# Patient Record
Sex: Female | Born: 1945 | Race: White | Hispanic: No | Marital: Married | Smoking: Never smoker
Health system: Southern US, Community
[De-identification: ages and names within clinical notes are randomized; demographics above are authoritative.]

## PROBLEM LIST (undated history)

## (undated) DIAGNOSIS — E7800 Pure hypercholesterolemia, unspecified: Secondary | ICD-10-CM

## (undated) DIAGNOSIS — I10 Essential (primary) hypertension: Secondary | ICD-10-CM

## (undated) DIAGNOSIS — E119 Type 2 diabetes mellitus without complications: Secondary | ICD-10-CM

## (undated) HISTORY — PX: KNEE SURGERY: SHX244

---

## 2004-07-01 ENCOUNTER — Encounter: Admission: RE | Admit: 2004-07-01 | Discharge: 2004-07-01 | Payer: Self-pay | Admitting: Family Medicine

## 2005-01-30 ENCOUNTER — Encounter: Admission: RE | Admit: 2005-01-30 | Discharge: 2005-01-30 | Payer: Self-pay | Admitting: Family Medicine

## 2005-02-10 ENCOUNTER — Ambulatory Visit (HOSPITAL_BASED_OUTPATIENT_CLINIC_OR_DEPARTMENT_OTHER): Admission: RE | Admit: 2005-02-10 | Discharge: 2005-02-10 | Payer: Self-pay | Admitting: Orthopedic Surgery

## 2005-02-10 ENCOUNTER — Ambulatory Visit (HOSPITAL_COMMUNITY): Admission: RE | Admit: 2005-02-10 | Discharge: 2005-02-10 | Payer: Self-pay | Admitting: Orthopedic Surgery

## 2007-04-01 ENCOUNTER — Other Ambulatory Visit: Admission: RE | Admit: 2007-04-01 | Discharge: 2007-04-01 | Payer: Self-pay | Admitting: Family Medicine

## 2007-04-12 ENCOUNTER — Encounter: Admission: RE | Admit: 2007-04-12 | Discharge: 2007-04-12 | Payer: Self-pay | Admitting: Family Medicine

## 2007-07-11 ENCOUNTER — Inpatient Hospital Stay (HOSPITAL_COMMUNITY): Admission: RE | Admit: 2007-07-11 | Discharge: 2007-07-13 | Payer: Self-pay | Admitting: Orthopedic Surgery

## 2007-10-24 ENCOUNTER — Inpatient Hospital Stay (HOSPITAL_COMMUNITY): Admission: RE | Admit: 2007-10-24 | Discharge: 2007-10-26 | Payer: Self-pay | Admitting: Orthopedic Surgery

## 2008-01-10 ENCOUNTER — Ambulatory Visit (HOSPITAL_BASED_OUTPATIENT_CLINIC_OR_DEPARTMENT_OTHER): Admission: RE | Admit: 2008-01-10 | Discharge: 2008-01-10 | Payer: Self-pay | Admitting: Orthopedic Surgery

## 2009-01-08 ENCOUNTER — Ambulatory Visit (HOSPITAL_COMMUNITY): Admission: RE | Admit: 2009-01-08 | Discharge: 2009-01-08 | Payer: Self-pay | Admitting: Orthopedic Surgery

## 2009-01-29 ENCOUNTER — Encounter: Admission: RE | Admit: 2009-01-29 | Discharge: 2009-01-29 | Payer: Self-pay | Admitting: Orthopedic Surgery

## 2009-02-20 ENCOUNTER — Encounter: Admission: RE | Admit: 2009-02-20 | Discharge: 2009-02-20 | Payer: Self-pay | Admitting: Orthopedic Surgery

## 2009-04-01 ENCOUNTER — Encounter: Admission: RE | Admit: 2009-04-01 | Discharge: 2009-04-01 | Payer: Self-pay | Admitting: Orthopedic Surgery

## 2009-06-20 ENCOUNTER — Encounter: Admission: RE | Admit: 2009-06-20 | Discharge: 2009-06-20 | Payer: Self-pay | Admitting: Orthopedic Surgery

## 2009-09-02 ENCOUNTER — Encounter: Admission: RE | Admit: 2009-09-02 | Discharge: 2009-09-02 | Payer: Self-pay | Admitting: Orthopedic Surgery

## 2009-10-31 ENCOUNTER — Encounter: Admission: RE | Admit: 2009-10-31 | Discharge: 2009-10-31 | Payer: Self-pay | Admitting: Orthopedic Surgery

## 2010-11-25 NOTE — Op Note (Signed)
NAMEROLINDA, IMPSON                 ACCOUNT NO.:  [id number]   MEDICAL RECORD NO.:  [id number]          PATIENT TYPE:  INP   LOCATION:  2550                         FACILITY:  [facility]   PHYSICIAN:  Robert A. Thurston Hole, M.D. DATE OF BIRTH:  07/25/1945   DATE OF PROCEDURE:  DATE OF DISCHARGE:                               OPERATIVE REPORT   PREOPERATIVE DIAGNOSIS:  Right knee degenerative joint disease.   POSTOPERATIVE DIAGNOSIS:  Right knee degenerative joint disease.   PROCEDURE:  Right total knee replacement using DePuy cemented total knee  system with #2.5 cemented femur and a #2.5 cemented tibia with 12.5 mm  polyethylene RP  tibial spacer and 32 mm polyethylene cemented patella.   SURGEON:  Elana Alm. Thurston Hole, M.D.   ASSISTANT:  Genelle Bal, PA   ANESTHESIA:  General.   OPERATIVE TIME:  1 hour and 20 minutes.   COMPLICATIONS:  None.   DESCRIPTION OF PROCEDURE:  Ms. Delawder  was brought in the operating room  on July 11, 2007 after a femoral nerve block was  placed in the  holding room by anesthesia.  She was placed on the operating room table  in the supine position.  She received Ancef 1 gm IV preoperatively for  prophylaxis.  Her right knee was examined under anesthesia.  Range of  motion -5 to 125 degrees with mild varus deformity of knee, stable  ligamentous examine with normal patellar tracking.  She had a Foley  catheter placed under sterile conditions after being placed under  general anesthesia.  Her right leg was then prepped using a sterile  DuraPrepand draped using sterile technique.  The leg was exsanguinated  and the thigh tourniquet elevated to 365 mm.  Initially, through a 15 cm  longitudinal incision based over the patella initial exposure was made.  The underlying subcutaneous tissues were incised along with the skin  incision.  A median arthrotomy was performed revealing an excessive  amount of normal-appearing joint fluid.  The articular surfaces  were  inspected.  She had grade 4 changes medially, grade 3 changes laterally,  and grade 3 and 4 changes in the patellofemoral joint.  Osteophytes were  removed from the femoral condyles and tibial plateau.  The medial and  lateral meniscal remnants were removed as well as the anterior cruciate  ligament.  An intramedullary drill was then drilled up the femoral canal  for replacement of the distal femoral cutting jig which was placed in  the appropriate amount of rotation, and a distal 11 mm cut was made.  Distal femur was then sized.  A 2.5 was found to be the appropriate  size, and then a 2.5 cutting jig was placed in the appropriate amount of  external rotation, and then these cuts were made.  Proximal tibia was  then exposed.  The tibia spines were removed with an oscillating saw.  The intramedullary drill was drilled down the tibial canal for placement  of the proximal tibial cutting jig which was placed in the appropriate  amount of position and rotation, and a proximal 6 mm cut was  made based  off the medial lower side.  Spacer blocks were then placed in flexion  and extension.  A 12.5 mm blocks gave excellent balancing and excellent  stability and excellent correction of her flexion and varus deformities.  The 2.5 mm tibial trial was placed and only cut tibial surface and found  to be an excellent fit, and then a keel cut was made.  The 2.5 femoral  trial was then placed, and with the 12.5 mm polyethylene RP spacer the  knee was reduced, taken through range of motion from 0 to 125 degrees  with excellent stability and excellent correction of her  flexion and  varus deformities.  The patella was then sized, and a resurfacing 8 mm  cut was then made, and three locking holes placed for a 32 mm patella.  The patella trial was placed and patellofemoral tracking was evaluated  and found to be normal.  At this point, it was felt that all the trial  components were of excellent size, fit  and stability.  They were then  removed, and the knee was the jet lavaged, irrigated with 3 L of saline.  The proximal tibia was then exposed, and the #2.5 tibial base plate with  cement backing and was hammered into position with an excellent fit with  excess cement being removed from around the edges.  The 2.5 femoral  component with cement backing was hammered into position also with an  excellent fit with excess cement being removed from around the edges.  The 12.5 mm polyethylene RP tibial spacer was placed on the tibial base  plate, the knee reduced, taken through range of motion from 0 to 125  degrees with excellent stability and excellent correction of her flexion  and varus deformities.  A 32 mm polyethylene cemented back patella was  then placed in its position and held there with a clamp.  After the  cement hardened, the patellofemoral tracking was again evaluated and  found to be normal.  At this point, it was felt that all the components  were of excellent size, fit and stability.  The knee was further  irrigated with saline and the knee tourniquet was released.  Hemostasis  was obtained with cautery.  The arthrotomy was then closed with #1  Ethibond suture over two medium Hemovac drains.  Subcutaneous tissue was  closed with 0 and 2-0 Vicryl.  Subcuticular layer was closed with 4-0  Monocryl.  Sterile dressings and a long-leg splint were applied.  The  patient was then awakened, extubated and taken to the recovery room in  stable condition.  Needle and sponge count was correct times two at the  end of the case.  Neurovascular status was normal as well.      Robert A. Thurston Hole, M.D.  Electronically Signed     RAW/MEDQ  D:  07/11/2007  T:  07/11/2007  Job:  [id number]

## 2010-11-25 NOTE — Op Note (Signed)
NAMEDAWN, CONVERY                 ACCOUNT NO.:  [id number]   MEDICAL RECORD NO.:  [id number]          PATIENT TYPE:  INP   LOCATION:  5001                         FACILITY:  [facility]   PHYSICIAN:  Robert A. Thurston Hole, M.D. DATE OF BIRTH:  March 22, 1946   DATE OF PROCEDURE:  10/24/2007  DATE OF DISCHARGE:                               OPERATIVE REPORT   PREOPERATIVE DIAGNOSIS:  Left knee degenerative joint disease.   POSTOPERATIVE DIAGNOSIS:  Left knee degenerative joint disease.   PROCEDURE:  Left total knee replacement using DePuy Cemented Total Knee  System with #2.5 cemented femur, #2.5 cemented tibia with 10-mm  polyethylene RP tibial spacer and 32-mm polyethylene cemented patella.   SURGEON:  Dr. Salvatore Marvel.   ASSISTANT:  Kirstin Shepperson, PA.   ANESTHESIA:  General.   OPERATIVE TIME:  One hour and 30 minutes.   COMPLICATIONS:  None.   DESCRIPTION OF PROCEDURE:  Ms. Malmberg was brought into operating room on  October 24, 2007, after a femoral nerve block was placed in holding by  anesthesia.  She was placed on the operative table in supine position.  She received Ancef 4 g IV preoperatively for prophylaxis.  After being  placed under general anesthesia, she had a Foley catheter placed under  sterile conditions.  Her left knee was examined under anesthesia.  Range  of motion from -5 to 125 degrees with mild varus deformity of knee, and  stable ligamentous exam with normal patellar tracking.  Left leg was  prepped using sterile DuraPrep and draped using sterile technique.  Leg  was exsanguinated and a thigh tourniquet elevated at 375 mm.  Initially,  through a 15-cm incision based over the patella, initial exposure was  made.  Underlying subcutaneous tissues were incised along with the skin  incision.  A median arthrotomy was performed revealing an excessive  amount of normal-appearing joint fluid.  The articular surfaces were  inspected.  She had grade 4 changes medially, grade  3 changes laterally,  and grade 3 and 4 changes in the patellofemoral joint.  Osteophytes  removed from the femoral condyles and tibial plateau.  The medial  lateral meniscal remnants were removed as well as the anterior cruciate  ligament.  An intramedullary drill was then drilled up the femoral canal  for placement of distal femoral cutting jig which was placed in the  appropriate amount of rotation, and a distal 11 mm cut was made.  The  distal femur was incised, 2.5 was found to be the appropriate size.  A  #2.5 cutting jig was placed in the appropriate amount of external  rotation, and then these cuts were made.  The proximal tibia was then  exposed.  The tibial spines were removed with an oscillating saw.  An  intramedullary drill was drilled down the tibial canal for placement of  proximal tibial cutting jig, which was placed in the appropriate amount  of rotation, and a proximal 4-mm cut was made based off the medial or  lower side.  Spacer blocks were then placed in flexion and extension.  The 10-mm blocks  gave excellent balancing, excellent stability, and  excellent correction of her flexion and varus deformities.  At this  point, the 2.5 tibial base plate trial was placed on the cut tibial  surface with an excellent fit, and the keel cut was made.  The 2.5 PCL  box cutter was placed on the distal femur, and then these cuts were  made.  At this point, the 2.5 femoral trial was placed with a 2.5 tibial  base plate trial and a 10-mm polyethylene RP tibial spacer.  The knee  was reduced, taken through range of motion from 0 to 125 degrees with  excellent stability and excellent correction of her flexion and varus  deformities and normal 32-mm patellar trial, which was placed, and again  patellofemoral tracking was evaluated and found to be normal.  At this  point, it was felt that all the trial components were of excellent size  and stability.  They were then removed.  The knee was  then jet lavage  irrigated with the use of saline.  The proximal tibia was then exposed.  A #2.5 tibial base plate trial was cemented back and was hammered in  position with an excellent fit with excess cement being removed from  around the edges.  The 2.5 femoral component with cement backing was  hammered in position also with an excellent fit with excess cement being  removed from around the edges.  The 10-mm polyethylene RP tibial spacer  was placed on tibial baseplate.  The knee reduced, taken through range  of motion from zero to 125 degrees with excellent stability and  excellent correction of her flexion and varus deformities.  The 32-mm  polyethylene cement backed patella was then placed in its position and  held there with a clamp.  After the cement hardened, again  patellofemoral tracking was evaluated and found to be normal.  At this  point, it was felt that all the components were of excellent size, fit,  and stability.  The wound was further irrigated with saline.  The  tourniquet was then released.  Hemostasis was obtained with cautery.  The arthrotomy was then closed with #1 Ethilon suture over 2 medium  Hemovac drains.  Subcutaneous tissues closed with zero 2-0 Vicryl,  subcuticular layer closed with 4-0 Monocryl.  Sterile dressings and long-  leg splint applied after the drains were hooked up to an [employer].  The  patient awakened, extubated, and taken to recovery in stable condition.  Needle and sponge count was correct x2 at the end of the case.  Neurovascular status normal.      Robert A. Thurston Hole, M.D.  Electronically Signed     RAW/MEDQ  D:  10/24/2007  T:  10/25/2007  Job:  [id number]

## 2010-11-25 NOTE — Op Note (Signed)
NAMEMARYBELLA, Kristin Roberts                 ACCOUNT NO.:  [id number]   MEDICAL RECORD NO.:  [id number]          PATIENT TYPE:  AMB   LOCATION:  DSC                          FACILITY:  [facility]   PHYSICIAN:  Robert A. Thurston Hole, M.D. DATE OF BIRTH:  Nov 19, 1945   DATE OF PROCEDURE:  01/10/2008  DATE OF DISCHARGE:  01/10/2008                               OPERATIVE REPORT   PREOPERATIVE DIAGNOSIS:  Left total knee arthrofibrosis.   POSTOPERATIVE DIAGNOSIS:  Left total knee arthrofibrosis.   PROCEDURE:  Left total knee examination under anesthesia followed by  manipulation with injection.   SURGEON:  Elana Alm. Thurston Hole, MD   ANESTHESIA:  General.   OPERATIVE TIME:  15 minutes.   COMPLICATIONS:  None.   INDICATIONS FOR PROCEDURE:  [name] is a woman who underwent a left  total knee replacement approximately 2-1/2 months ago.  She has had  persistent problems with loss of motion and pain, secondary to  arthrofibrosis despite aggressive physical therapy, and is now to  undergo EUA manipulation and injection.   DESCRIPTION OF PROCEDURE:  [name] was brought to the operating room  on January 10, 2008, placed on the operative table in supine position.  After being placed under general anesthesia, she received Ancef 1 g IV  preoperatively for prophylaxis.  Her left knee was examined under  anesthesia.  Initial range of motion was from minus 5 to 90 degrees.  A  gentle manipulation was carried out improving flexion to 120 degrees and  extension to minus 2 degrees, breaking up soft adhesions with normal  patellar tracking.  After this was done, the knee was injected under  sterile conditions with 40 mg of Depo-Medrol and 10 mL of 0.2% Marcaine  with epinephrine.  The knee was brought back to this range of motion  multiple times without complications.  At this point, she was awakened  and taken to recovery room in stable condition.  She tolerated the  procedure well.   FOLLOWUP CARE:  She will be  treated as an outpatient on Vicodin for pain  with early aggressive physical therapy.  She will be back in the office  in a week for recheck and followup.      Robert A. Thurston Hole, M.D.  Electronically Signed     RAW/MEDQ  D:  02/28/2008  T:  02/29/2008  Job:  [id number]

## 2010-11-28 NOTE — Op Note (Signed)
Kristin Roberts, Kristin Roberts                 ACCOUNT NO.:  [id number]   MEDICAL RECORD NO.:  [id number]          PATIENT TYPE:  AMB   LOCATION:  DSC                          FACILITY:  [facility]   PHYSICIAN:  Robert A. Thurston Hole, M.D. DATE OF BIRTH:  [birth year]   DATE OF PROCEDURE:  02/10/2005  DATE OF DISCHARGE:                                 OPERATIVE REPORT   PREOPERATIVE DIAGNOSIS:  1.  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  2.  Right shoulder rotator cuff tendinitis.   POSTOPERATIVE DIAGNOSIS:  1.  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  2.  Right shoulder rotator cuff tendinitis.   PROCEDURE:  1.  Right knee exam under anesthesia followed by arthroscopic partial medial      and lateral meniscectomies.  2.  Right knee chondroplasty with partial synovectomy.  3.  Right shoulder cortisone injection.   SURGEON:  Elana Alm. Thurston Hole, M.D.   ASSISTANT:  Julien Girt, P.A.-C.   ANESTHESIA:  Local/general.   OPERATIVE TIME:  45 minutes.   COMPLICATIONS:  None.   INDICATIONS FOR PROCEDURE:  Kristin Roberts is a 65 year old woman who has had  over six months of right knee and right shoulder pain with exam and MRI of  the knee showing medial meniscus tear with chondromalacia and synovitis and  exam consistent with right shoulder rotator cuff tendinitis.  She failed  conservative care and is now to undergo right knee arthroscopy and right  shoulder injection.   DESCRIPTION OF PROCEDURE:  Kristin Roberts was brought to the operating room on  February 10, 2005, after a knee block was placed in the holding room by  anesthesia.  She was placed on the operating table in supine position.  After being placed under MAC anesthesia, her right shoulder was examined.  She had full range of motion and her shoulder was stable.  The subacromial  space was injected with 40 mg Depo-Medrol and 3 mL of 0.25%  Marcaine under  sterile conditions.  Her right knee was examined and had range of motion  from 0 to 130 degrees, 1-2+ crepitation, and a stable ligamentous exam with  normal patellar tracking.  The right leg was prepped with DuraPrep and  draped using sterile technique.  Originally through an anterolateral portal,  the arthroscope with a pump attachment was placed and through an  anteromedial portal, an arthroscopic probe was placed.  On initial  inspection of the medial compartment, she was found to have 50% grade 3  chondromalacia which was debrided, medial meniscus tear, posterior and  medial horn, of which 50% was resected back to a stable rim.  The  intercondylar notch was inspected and the anterior and posterior cruciate  ligaments were normal.  The lateral compartment was inspected, 25% grade 3  and chondromalacia was debrided.  Lateral meniscus tear, 25%, posterolateral  corner, which was resected back to a stable rim.  The patellofemoral joint  showed 50% grade 3 chondromalacia which was debrided.  The patellas tracked  normally.  Moderate synovitis in the medial and lateral gutters were  debrided, otherwise, they were free of pathology.  After this was done, it  was felt that all pathology had been satisfactorily addressed.  The  instruments were removed.  The portals were closed with a 3-0 nylon suture.  The knee was injected with 0.25% Marcaine with epinephrine and 4 mg  morphine.  Sterile dressings were applied.  The patient was awakened and  taken to the recovery room in stable condition.   FOLLOW UP:  Kristin Roberts will be followed as an outpatient on Percocet and  Mobic.  She will be seen back in the office in a week for sutures out and  follow up.       RAW/MEDQ  D:  02/10/2005  T:  02/10/2005  Job:  [id number]

## 2011-04-07 LAB — URINE CULTURE
Colony Count: 25000
Colony Count: NO GROWTH
Culture: NO GROWTH

## 2011-04-07 LAB — DIFFERENTIAL
Basophils Absolute: 0
Basophils Relative: 1
Eosinophils Absolute: 0.1
Eosinophils Relative: 2
Lymphocytes Relative: 26
Lymphs Abs: 1.3
Monocytes Absolute: 0.4
Monocytes Relative: 7
Neutro Abs: 3.4
Neutrophils Relative %: 65

## 2011-04-07 LAB — COMPREHENSIVE METABOLIC PANEL
ALT: 26
AST: 25
Albumin: 4.3
Alkaline Phosphatase: 118 — ABNORMAL HIGH
BUN: 15
CO2: 26
Calcium: 9.8
Chloride: 102
Creatinine, Ser: 0.79
GFR calc Af Amer: >60
GFR calc non Af Amer: >60
Glucose, Bld: 123 — ABNORMAL HIGH
Potassium: 4.3
Sodium: 138
Total Bilirubin: 0.6
Total Protein: 7.7

## 2011-04-07 LAB — BASIC METABOLIC PANEL
BUN: 6
BUN: 7
CO2: 25
CO2: 30
Calcium: 8.5
Calcium: 8.8
Chloride: 102
Chloride: 100
Creatinine, Ser: 0.8
Creatinine, Ser: 0.8
GFR calc Af Amer: >60
GFR calc Af Amer: >60
GFR calc non Af Amer: >60
GFR calc non Af Amer: >60
Glucose, Bld: 159 — ABNORMAL HIGH
Glucose, Bld: 150 — ABNORMAL HIGH
Potassium: 4.1
Potassium: 3.5
Sodium: 134 — ABNORMAL LOW
Sodium: 137

## 2011-04-07 LAB — URINALYSIS, ROUTINE W REFLEX MICROSCOPIC
Bilirubin Urine: NEGATIVE
Glucose, UA: NEGATIVE
Hgb urine dipstick: NEGATIVE
Ketones, ur: NEGATIVE
Nitrite: NEGATIVE
Protein, ur: NEGATIVE
Specific Gravity, Urine: 1.016
Urobilinogen, UA: 0.2
pH: 6

## 2011-04-07 LAB — CBC
HCT: 37.4
HCT: 47.1 — ABNORMAL HIGH
Hemoglobin: 13.1
Hemoglobin: 16.1 — ABNORMAL HIGH
Hemoglobin: 12.6
MCHC: 34.2
MCHC: 35
MCV: 88
MCV: 88.2
Platelets: 181
Platelets: 237
RBC: 4.24
RBC: 5.35 — ABNORMAL HIGH
RBC: 4.12
RDW: 13
RDW: 13.3
RDW: 13.2
WBC: 5.2
WBC: 7.9
WBC: 8.5

## 2011-04-07 LAB — TYPE AND SCREEN
ABO/RH(D): A POS
Antibody Screen: NEGATIVE

## 2011-04-07 LAB — PROTIME-INR
INR: 0.9
INR: 1.1
INR: 1.5
Prothrombin Time: 12.1
Prothrombin Time: 14.9

## 2011-04-07 LAB — APTT: aPTT: 26

## 2011-04-07 LAB — URINE MICROSCOPIC-ADD ON

## 2011-04-09 LAB — POCT HEMOGLOBIN-HEMACUE: Hemoglobin: 13.7

## 2011-04-17 LAB — BASIC METABOLIC PANEL
BUN: 10
BUN: 11
CO2: 28
Chloride: 105
Chloride: 98
Creatinine, Ser: 0.74
Creatinine, Ser: 0.91
Glucose, Bld: 160 — ABNORMAL HIGH

## 2011-04-17 LAB — URINE CULTURE: Culture: NO GROWTH

## 2011-04-17 LAB — URINALYSIS, ROUTINE W REFLEX MICROSCOPIC
Bilirubin Urine: NEGATIVE
Hgb urine dipstick: NEGATIVE
Specific Gravity, Urine: 1.016
pH: 5.5

## 2011-04-17 LAB — CBC
Hemoglobin: 16.7 — ABNORMAL HIGH
MCHC: 34.4
MCV: 90.1
MCV: 90.9
Platelets: 208
Platelets: 208
RDW: 12.1
RDW: 12.6
RDW: 12.9
WBC: 12.3 — ABNORMAL HIGH
WBC: 6.7
WBC: 9.1

## 2011-04-17 LAB — COMPREHENSIVE METABOLIC PANEL
ALT: 39 — ABNORMAL HIGH
Albumin: 4.4
Alkaline Phosphatase: 105
Glucose, Bld: 121 — ABNORMAL HIGH
Potassium: 4.4
Sodium: 131 — ABNORMAL LOW
Total Protein: 7.7

## 2011-04-17 LAB — DIFFERENTIAL
Basophils Relative: 0
Eosinophils Absolute: 0.1 — ABNORMAL LOW
Monocytes Absolute: 0.4
Monocytes Relative: 6
Neutrophils Relative %: 71

## 2011-04-17 LAB — PROTIME-INR
Prothrombin Time: 14.2
Prothrombin Time: 18.7 — ABNORMAL HIGH

## 2011-04-17 LAB — URINE MICROSCOPIC-ADD ON

## 2011-04-17 LAB — TYPE AND SCREEN: Antibody Screen: NEGATIVE

## 2011-04-17 LAB — HEMOGLOBIN A1C: Mean Plasma Glucose: 172

## 2011-12-24 ENCOUNTER — Other Ambulatory Visit: Payer: Self-pay | Admitting: Internal Medicine

## 2011-12-24 DIAGNOSIS — R945 Abnormal results of liver function studies: Secondary | ICD-10-CM

## 2012-01-05 ENCOUNTER — Ambulatory Visit
Admission: RE | Admit: 2012-01-05 | Discharge: 2012-01-05 | Disposition: A | Discharge status: Home / Self Care | Payer: BC Managed Care – PPO | Source: Ambulatory Visit | Attending: Internal Medicine | Admitting: Internal Medicine

## 2012-01-05 DIAGNOSIS — R945 Abnormal results of liver function studies: Secondary | ICD-10-CM

## 2012-05-25 ENCOUNTER — Other Ambulatory Visit: Payer: Self-pay | Admitting: Internal Medicine

## 2012-05-25 DIAGNOSIS — Z1231 Encounter for screening mammogram for malignant neoplasm of breast: Secondary | ICD-10-CM

## 2012-06-28 ENCOUNTER — Ambulatory Visit
Admission: RE | Admit: 2012-06-28 | Discharge: 2012-06-28 | Disposition: A | Discharge status: Home / Self Care | Payer: Medicare Other | Source: Ambulatory Visit | Attending: Internal Medicine | Admitting: Internal Medicine

## 2012-06-28 DIAGNOSIS — Z1231 Encounter for screening mammogram for malignant neoplasm of breast: Secondary | ICD-10-CM

## 2016-04-14 ENCOUNTER — Other Ambulatory Visit: Payer: Self-pay | Admitting: Internal Medicine

## 2016-04-14 DIAGNOSIS — Z1231 Encounter for screening mammogram for malignant neoplasm of breast: Secondary | ICD-10-CM

## 2016-04-23 ENCOUNTER — Ambulatory Visit
Admission: RE | Admit: 2016-04-23 | Discharge: 2016-04-23 | Disposition: A | Discharge status: Home / Self Care | Payer: Medicare Other | Source: Ambulatory Visit | Attending: Internal Medicine | Admitting: Internal Medicine

## 2016-04-23 DIAGNOSIS — Z1231 Encounter for screening mammogram for malignant neoplasm of breast: Secondary | ICD-10-CM

## 2016-04-28 ENCOUNTER — Other Ambulatory Visit: Payer: Self-pay | Admitting: Internal Medicine

## 2016-04-28 DIAGNOSIS — R928 Other abnormal and inconclusive findings on diagnostic imaging of breast: Secondary | ICD-10-CM

## 2016-05-08 ENCOUNTER — Ambulatory Visit
Admission: RE | Admit: 2016-05-08 | Discharge: 2016-05-08 | Disposition: A | Discharge status: Home / Self Care | Payer: Medicare Other | Source: Ambulatory Visit | Attending: Internal Medicine | Admitting: Internal Medicine

## 2016-05-08 DIAGNOSIS — R928 Other abnormal and inconclusive findings on diagnostic imaging of breast: Secondary | ICD-10-CM

## 2017-02-01 ENCOUNTER — Ambulatory Visit (HOSPITAL_COMMUNITY)
Admission: EM | Admit: 2017-02-01 | Discharge: 2017-02-01 | Disposition: A | Discharge status: Home / Self Care | Payer: Medicare Other

## 2017-02-01 ENCOUNTER — Encounter (HOSPITAL_COMMUNITY): Payer: Self-pay | Admitting: Emergency Medicine

## 2017-02-01 DIAGNOSIS — W19XXXA Unspecified fall, initial encounter: Secondary | ICD-10-CM

## 2017-02-01 DIAGNOSIS — S81811A Laceration without foreign body, right lower leg, initial encounter: Secondary | ICD-10-CM

## 2017-02-01 DIAGNOSIS — Z23 Encounter for immunization: Secondary | ICD-10-CM

## 2017-02-01 HISTORY — DX: Type 2 diabetes mellitus without complications: E11.9

## 2017-02-01 HISTORY — DX: Essential (primary) hypertension: I10

## 2017-02-01 HISTORY — DX: Pure hypercholesterolemia, unspecified: E78.00

## 2017-02-01 MED ORDER — TETANUS-DIPHTH-ACELL PERTUSSIS 5-2.5-18.5 LF-MCG/0.5 IM SUSP
0.5000 mL | Freq: Once | INTRAMUSCULAR | Status: AC
Start: 2017-02-01 — End: 2017-02-01
  Administered 2017-02-01: 0.5 mL via INTRAMUSCULAR

## 2017-02-01 MED ORDER — CEPHALEXIN 500 MG PO CAPS: 500.0000 mg | ORAL_CAPSULE | Freq: Four times a day (QID) | ORAL | 0 refills | Status: DC

## 2017-02-01 MED ORDER — LIDOCAINE-EPINEPHRINE (PF) 2 %-1:200000 IJ SOLN
INTRAMUSCULAR | Status: AC
  Filled 2017-02-01: qty 20

## 2017-02-01 MED ORDER — TETANUS-DIPHTH-ACELL PERTUSSIS 5-2.5-18.5 LF-MCG/0.5 IM SUSP
INTRAMUSCULAR | Status: AC
  Filled 2017-02-01: qty 0.5

## 2017-02-01 MED ORDER — HYDROCODONE-ACETAMINOPHEN 5-325 MG PO TABS: 1.0000 | ORAL_TABLET | ORAL | 0 refills | Status: DC | PRN

## 2017-02-01 NOTE — ED Triage Notes (Signed)
Patient slipped on rock this morning, fell.  Laceration to right lower leg.  Soreness to right elbow.

## 2017-02-01 NOTE — Discharge Instructions (Signed)
Follow up in 10 days for suture removal.

## 2017-02-01 NOTE — ED Provider Notes (Signed)
CSN: [id number]     Arrival date & time 02/01/17  1334 History   None    Chief Complaint  Patient presents with  . Fall   (Consider location/radiation/quality/duration/timing/severity/associated sxs/prior Treatment) Patient c/o laceration to right leg when she slipped and fell on a rock.  She is not UTD with tetanus.     The history is provided by the patient.  Fall  This is a new problem. The problem occurs constantly. The problem has not changed since onset.Nothing aggravates the symptoms.    Past Medical History:  Diagnosis Date  . Diabetes mellitus without complication (HCC)   . High cholesterol   . Hypertension    Past Surgical History:  Procedure Laterality Date  . KNEE SURGERY     No family history on file. Social History  Substance Use Topics  . Smoking status: Not on file  . Smokeless tobacco: Not on file  . Alcohol use Not on file   OB History    No data available     Review of Systems  Constitutional: Negative.   HENT: Negative.   Eyes: Negative.   Respiratory: Negative.   Cardiovascular: Negative.   Gastrointestinal: Negative.   Endocrine: Negative.   Genitourinary: Negative.   Musculoskeletal: Negative.   Skin: Positive for wound.  Allergic/Immunologic: Negative.   Neurological: Negative.   Hematological: Negative.   Psychiatric/Behavioral: Negative.     Allergies  Patient has no known allergies.  Home Medications   Prior to Admission medications   Medication Sig Start Date End Date Taking? Authorizing Provider  aspirin EC 81 MG tablet Take 81 mg by mouth daily.   Yes [provider]  atorvastatin (LIPITOR) 10 MG tablet Take 10 mg by mouth daily.   Yes [provider]  colesevelam (WELCHOL) 625 MG tablet Take by mouth 2 (two) times daily with a meal.   Yes [provider]  diclofenac sodium (VOLTAREN) 1 % GEL Apply topically 4 (four) times daily.   Yes [provider]  glimepiride (AMARYL) 4 MG tablet  Take 4 mg by mouth daily with breakfast.   Yes [provider]  lisinopril (PRINIVIL,ZESTRIL) 40 MG tablet Take 40 mg by mouth daily.   Yes [provider]  metFORMIN (GLUCOPHAGE) 1000 MG tablet Take 1,000 mg by mouth 2 (two) times daily with a meal.   Yes [provider]  cephALEXin (KEFLEX) 500 MG capsule Take 1 capsule (500 mg total) by mouth 4 (four) times daily. 02/01/17   Deatra Canter, FNP  HYDROcodone-acetaminophen (NORCO/VICODIN) 5-325 MG tablet Take 1 tablet by mouth every 4 (four) hours as needed. 02/01/17   Deatra Canter, FNP   Meds Ordered and Administered this Visit   Medications  Tdap (BOOSTRIX) injection 0.5 mL (0.5 mLs Intramuscular Given 02/01/17 1459)    There were no vitals taken for this visit. No data found.   Physical Exam  Constitutional: She is oriented to person, place, and time. She appears well-developed and well-nourished.  HENT:  Head: Normocephalic and atraumatic.  Eyes: Pupils are equal, round, and reactive to light. Conjunctivae and EOM are normal.  Neck: Normal range of motion. Neck supple.  Cardiovascular: Normal rate, regular rhythm and normal heart sounds.   Pulmonary/Chest: Effort normal and breath sounds normal.  Neurological: She is alert and oriented to person, place, and time.  Skin:  Laceration to right leg to lateral side of tibia and is approx 10 cm with a flap and tear like laceration.  Neurovascular  is intact.  Nursing note and vitals reviewed.   Urgent Care Course     .Marland Kitchen.Laceration Repair Date/Time: 02/01/2017 4:42 PM Performed by: Deatra CanterXFORD, Calie Buttrey J Authorized by: Deatra CanterXFORD, Mysty Kielty J   Consent:    Consent obtained:  Verbal   Consent given by:  Patient and spouse   Risks discussed:  Infection, pain, need for additional repair, retained foreign body, nerve damage, poor wound healing, poor cosmetic result, tendon damage and vascular damage   Alternatives discussed:  No treatment Anesthesia (see MAR for  exact dosages):    Anesthesia method:  Local infiltration   Local anesthetic:  Lidocaine 1% WITH epi Laceration details:    Location:  Leg   Leg location:  R lower leg   Length (cm):  10   Depth (mm):  5 Repair type:    Repair type:  Simple Pre-procedure details:    Preparation:  Patient was prepped and draped in usual sterile fashion Exploration:    Wound extent: areolar tissue violated     Contaminated: no   Treatment:    Area cleansed with:  Betadine and saline   Irrigation solution:  Sterile saline   Irrigation volume:  200 ml   Irrigation method:  Syringe   Visualized foreign bodies/material removed: no   Skin repair:    Repair method:  Sutures   Suture size:  3-0   Suture technique:  Vertical mattress   Number of sutures:  3 Approximation:    Approximation:  Close   Vermilion border: well-aligned   Post-procedure details:    Dressing:  Antibiotic ointment, non-adherent dressing and adhesive bandage   Patient tolerance of procedure:  Tolerated well, no immediate complications Comments:     #10 simple sutures placed to approximate   (including critical care time)  Labs Review Labs Reviewed - No data to display  Imaging Review No results found.   Visual Acuity Review  Right Eye Distance:   Left Eye Distance:   Bilateral Distance:    Right Eye Near:   Left Eye Near:    Bilateral Near:         MDM   1. Fall, initial encounter   2. Laceration of right lower extremity excluding thigh, initial encounter    #3 vertical mattress sutures and #10 simple sutures placed.  Follow up in 10 - 12 days for suture removal.    Keflex 500mg  one po qid x 10 days #40 Norco 5/325 one po q 6 hours prn #6      Deatra CanterOxford, Katira Dumais J, FNP 02/01/17 1644

## 2017-02-15 ENCOUNTER — Encounter (HOSPITAL_COMMUNITY): Payer: Self-pay | Admitting: Nurse Practitioner

## 2017-02-15 ENCOUNTER — Ambulatory Visit (HOSPITAL_COMMUNITY)
Admission: EM | Admit: 2017-02-15 | Discharge: 2017-02-15 | Disposition: A | Discharge status: Home / Self Care | Payer: Medicare Other | Attending: Internal Medicine | Admitting: Internal Medicine

## 2017-02-15 DIAGNOSIS — Z4802 Encounter for removal of sutures: Secondary | ICD-10-CM

## 2017-02-15 MED ORDER — LIDOCAINE-EPINEPHRINE-TETRACAINE (LET) SOLUTION
NASAL | Status: AC
  Filled 2017-02-15: qty 3

## 2017-02-15 MED ORDER — LIDOCAINE-EPINEPHRINE-TETRACAINE (LET) SOLUTION
3.0000 mL | Freq: Once | NASAL | Status: AC
  Administered 2017-02-15: 3 mL via TOPICAL

## 2017-02-15 NOTE — Discharge Instructions (Addendum)
Wash wound gently with soap/water once or twice daily, apply antibiotic ointment. Recheck for any increasing redness/swelling/pain/discharge, or new fever greater than 100.5. I believe that all stitches were removed; however if there is a persistent nonhealing/pimply area, I would recheck this area.

## 2017-02-15 NOTE — ED Triage Notes (Addendum)
Pt presents with c/o suture removal. She was here on 7/23 after injuring her leg on a rock and sutures were placed. She went to her primary care doctor today for suture removal. Due to swelling around the sutures her PCP could not remove the sutures. She was referred to urgent care for further evaluation of swelling, redness around the sutures and possible removal

## 2017-02-15 NOTE — ED Provider Notes (Signed)
[facility]    CSN: [id number] Arrival date & time: 02/15/17  1636     History   Chief Complaint Chief Complaint  Patient presents with  . Suture / Staple Removal    HPI Kristin Roberts is a 71 y.o. female. Here for suture removal; had laceration repair of a right lower leg laceration on 7/23.  Has some persistent redness and swelling of the site, although this is not increasing, has been improving gradually. Site is sore, but not with increasing soreness. Had one suture removed at PCPs office today, with recommendation for soaking scabbed areas to facilitate the remainder of suture removal tomorrow. Patient would like these removed sooner if possible.    HPI  Past Medical History:  Diagnosis Date  . Diabetes mellitus without complication (HCC)   . High cholesterol   . Hypertension      Past Surgical History:  Procedure Laterality Date  . KNEE SURGERY       Home Medications    Prior to Admission medications   Medication Sig Start Date End Date Taking? Authorizing Provider  aspirin EC 81 MG tablet Take 81 mg by mouth daily.   Yes [provider]  atorvastatin (LIPITOR) 10 MG tablet Take 10 mg by mouth daily.   Yes [provider]  cephALEXin (KEFLEX) 500 MG capsule Take 1 capsule (500 mg total) by mouth 4 (four) times daily. 02/01/17  Yes Deatra Canter, FNP  colesevelam [facility]) 625 MG tablet Take by mouth 2 (two) times daily with a meal.   Yes [provider]  diclofenac sodium (VOLTAREN) 1 % GEL Apply topically 4 (four) times daily.   Yes [provider]  glimepiride (AMARYL) 4 MG tablet Take 4 mg by mouth daily with breakfast.   Yes [provider]  lisinopril (PRINIVIL,ZESTRIL) 40 MG tablet Take 40 mg by mouth daily.   Yes [provider]  metFORMIN (GLUCOPHAGE) 1000 MG tablet Take 1,000 mg by mouth 2 (two) times daily with a meal.   Yes [provider]  HYDROcodone-acetaminophen  (NORCO/VICODIN) 5-325 MG tablet Take 1 tablet by mouth every 4 (four) hours as needed. 02/01/17   Deatra Canter, FNP    Family History History reviewed. No pertinent family history.  Social History Social History  Substance Use Topics  . Smoking status: Never Smoker  . Smokeless tobacco: Never Used  . Alcohol use No     Allergies   Patient has no known allergies.   Review of Systems Review of Systems  All other systems reviewed and are negative.    Physical Exam Triage Vital Signs ED Triage Vitals  Enc Vitals Group     BP 02/15/17 1757 136/75     Pulse Rate 02/15/17 1757 85     Resp 02/15/17 1757 16     Temp 02/15/17 1757 98 F (36.7 C)     Temp Source 02/15/17 1757 Oral     SpO2 02/15/17 1757 100 %     Weight --      Height --      Pain Score 02/15/17 1804 6     Pain Loc --    Updated Vital Signs BP 136/75   Pulse 85   Temp 98 F (36.7 C) (Oral)   Resp 16   SpO2 100%   Physical Exam  Constitutional: She is oriented to person, place, and time. No distress.  HENT:  Head: Atraumatic.  Eyes:  Conjugate gaze observed, no eye redness/discharge  Neck: Neck supple.  Cardiovascular: Normal rate.   Pulmonary/Chest: No respiratory distress.  Abdominal: She exhibits no distension.  Musculoskeletal: Normal range of motion.  Neurological: She is alert and oriented to person, place, and time.  Skin: Skin is warm and dry.  Irregular laceration with puffiness and mild erythema of the right anterolateral lower leg. Wound appears to be healing nicely, not able to express any drainage, no fluctuant areas.  Nursing note and vitals reviewed.    UC Treatments / Results   Procedures Procedures (including critical care time)  Medications Ordered in UC Medications  lidocaine-EPINEPHrine-tetracaine (LET) solution (3 mLs Topical Given 02/15/17 1912)   14 stitches removed.     Final Clinical Impressions(s) / UC Diagnoses   Final diagnoses:  Visit for suture  removal   Wash wound gently with soap/water once or twice daily, apply antibiotic ointment. Recheck for any increasing redness/swelling/pain/discharge, or new fever greater than 100.5. I believe that all stitches were removed; however if there is a persistent nonhealing/pimply area, I would recheck this area.   Controlled Substance Prescriptions [place] Controlled Substance Registry consulted? Not Applicable   Eustace MooreMurray, Courtnie Brenes W, MD 02/17/17 2129

## 2018-04-05 ENCOUNTER — Other Ambulatory Visit: Payer: Self-pay | Admitting: Internal Medicine

## 2018-04-05 DIAGNOSIS — R945 Abnormal results of liver function studies: Secondary | ICD-10-CM

## 2018-08-25 ENCOUNTER — Other Ambulatory Visit: Payer: Self-pay | Admitting: Cardiology

## 2018-08-25 DIAGNOSIS — I6523 Occlusion and stenosis of bilateral carotid arteries: Secondary | ICD-10-CM

## 2018-10-04 ENCOUNTER — Other Ambulatory Visit: Payer: Self-pay

## 2018-11-09 ENCOUNTER — Other Ambulatory Visit: Payer: Self-pay | Admitting: Cardiology

## 2018-11-09 DIAGNOSIS — I350 Nonrheumatic aortic (valve) stenosis: Secondary | ICD-10-CM

## 2019-05-05 ENCOUNTER — Ambulatory Visit (INDEPENDENT_AMBULATORY_CARE_PROVIDER_SITE_OTHER): Payer: Medicare Other

## 2019-05-05 ENCOUNTER — Other Ambulatory Visit: Payer: Self-pay

## 2019-05-05 DIAGNOSIS — I6523 Occlusion and stenosis of bilateral carotid arteries: Secondary | ICD-10-CM | POA: Diagnosis not present

## 2019-05-08 ENCOUNTER — Other Ambulatory Visit: Payer: Self-pay | Admitting: Cardiology

## 2019-05-08 DIAGNOSIS — I6523 Occlusion and stenosis of bilateral carotid arteries: Secondary | ICD-10-CM

## 2019-05-08 NOTE — Progress Notes (Signed)
LVM with details.

## 2019-05-22 ENCOUNTER — Other Ambulatory Visit: Payer: Self-pay

## 2019-05-22 ENCOUNTER — Ambulatory Visit (INDEPENDENT_AMBULATORY_CARE_PROVIDER_SITE_OTHER): Payer: Medicare Other

## 2019-05-22 DIAGNOSIS — I350 Nonrheumatic aortic (valve) stenosis: Secondary | ICD-10-CM | POA: Diagnosis not present

## 2019-05-29 ENCOUNTER — Ambulatory Visit: Payer: Medicare Other | Admitting: Cardiology

## 2019-05-30 ENCOUNTER — Encounter: Payer: Self-pay | Admitting: Cardiology

## 2019-05-30 ENCOUNTER — Ambulatory Visit: Payer: Medicare Other | Admitting: Cardiology

## 2019-05-31 NOTE — Progress Notes (Signed)
05/24/2019: Cholesterol 148, triglycerides 166, HDL 44, LDL 76.

## 2019-06-05 ENCOUNTER — Encounter: Payer: Self-pay | Admitting: Cardiology

## 2019-06-05 ENCOUNTER — Ambulatory Visit: Payer: Medicare Other | Admitting: Cardiology

## 2019-06-05 ENCOUNTER — Other Ambulatory Visit: Payer: Self-pay

## 2019-06-05 VITALS — BP 136/63 | HR 82 | Temp 97.8°F | Ht 63.0 in | Wt 206.8 lb

## 2019-06-05 DIAGNOSIS — I6523 Occlusion and stenosis of bilateral carotid arteries: Secondary | ICD-10-CM | POA: Diagnosis not present

## 2019-06-05 DIAGNOSIS — I350 Nonrheumatic aortic (valve) stenosis: Secondary | ICD-10-CM

## 2019-06-05 DIAGNOSIS — I10 Essential (primary) hypertension: Secondary | ICD-10-CM

## 2019-06-05 DIAGNOSIS — I453 Trifascicular block: Secondary | ICD-10-CM

## 2019-06-05 DIAGNOSIS — E7800 Pure hypercholesterolemia, unspecified: Secondary | ICD-10-CM

## 2019-06-05 NOTE — Progress Notes (Signed)
Primary Physician/Referring:  Pearson Grippe, MD  Patient ID: Kristin Roberts, female    DOB: Sep 26, 1945, 73 y.o.   MRN: [id number]  Chief Complaint  Patient presents with  . Carotid Stenosis  . Hypertension  . Hyperlipidemia  . Follow-up    27yr   HPI:    HPI: Kristin Roberts  is a 73 y.o. with hypertension, hyperlipidemia, hepatic steatosis, type 2 diabetes, trace aortic stenosis and asymptomatic bilateral carotid stenosis here for 1 year follow up.   No symptoms of chest pain, dyspnea, palpitations, syncope, or symptoms suggestive of claudication or TIA.  She has h/o hepatic steatosis and mildly abnormal liver enzymes. She is now on low dose Atorvastatin, liver enzymes have remained stable.   Past Medical History:  Diagnosis Date  . Diabetes mellitus without complication (HCC)   . High cholesterol   . Hypertension    Past Surgical History:  Procedure Laterality Date  . KNEE SURGERY     Social History   Socioeconomic History  . Marital status: Married    Spouse name: Not on file  . Number of children: 3  . Years of education: Not on file  . Highest education level: Not on file  Occupational History  . Not on file  Social Needs  . Financial resource strain: Not on file  . Food insecurity    Worry: Not on file    Inability: Not on file  . Transportation needs    Medical: Not on file    Non-medical: Not on file  Tobacco Use  . Smoking status: Never Smoker  . Smokeless tobacco: Never Used  Substance and Sexual Activity  . Alcohol use: No  . Drug use: No  . Sexual activity: Not on file  Lifestyle  . Physical activity    Days per week: Not on file    Minutes per session: Not on file  . Stress: Not on file  Relationships  . Social Musician on phone: Not on file    Gets together: Not on file    Attends religious service: Not on file    Active member of club or organization: Not on file    Attends meetings of clubs or organizations: Not on file     Relationship status: Not on file  . Intimate partner violence    Fear of current or ex partner: Not on file    Emotionally abused: Not on file    Physically abused: Not on file    Forced sexual activity: Not on file  Other Topics Concern  . Not on file  Social History Narrative  . Not on file   ROS  Review of Systems  Constitution: Negative for decreased appetite, malaise/fatigue, weight gain and weight loss.  Eyes: Negative for visual disturbance.  Cardiovascular: Positive for claudication (night cramps). Negative for chest pain, dyspnea on exertion, leg swelling, orthopnea, palpitations and syncope.  Respiratory: Negative for hemoptysis and wheezing.   Endocrine: Negative for cold intolerance and heat intolerance.  Hematologic/Lymphatic: Does not bruise/bleed easily.  Skin: Negative for nail changes.  Musculoskeletal: Positive for joint pain (bilateral knee). Negative for muscle weakness and myalgias.  Gastrointestinal: Negative for abdominal pain, change in bowel habit, nausea and vomiting.  Neurological: Negative for difficulty with concentration, dizziness, focal weakness and headaches.  Psychiatric/Behavioral: Negative for altered mental status and suicidal ideas.  All other systems reviewed and are negative.  Objective  Blood pressure 136/63, pulse 82, temperature 97.8 F (36.6 C),  height 5\' 3"  (1.6 m), weight 206 lb 12.8 oz (93.8 kg), SpO2 99 %. Body mass index is 36.63 kg/m.   Physical Exam  Constitutional: She is oriented to person, place, and time. Vital signs are normal. She appears well-developed and well-nourished.  HENT:  Head: Normocephalic and atraumatic.  Neck: Normal range of motion.  Cardiovascular: Normal rate and regular rhythm.  Murmur heard.  Early systolic murmur is present with a grade of 2/6 at the upper right sternal border radiating to the neck and lower right sternal border. Pulses:      Carotid pulses are on the right side with bruit and on  the left side with bruit.      Popliteal pulses are 2+ on the right side and 2+ on the left side.       Dorsalis pedis pulses are 1+ on the right side and 1+ on the left side.       Posterior tibial pulses are 1+ on the right side and 1+ on the left side.  Femoral pulse difficult to feel due to obesity and pannus. No JVD, no leg edema.   Pulmonary/Chest: Effort normal and breath sounds normal. No accessory muscle usage. No respiratory distress.  Abdominal: Soft. Bowel sounds are normal.  Musculoskeletal: Normal range of motion.  Neurological: She is alert and oriented to person, place, and time.  Skin: Skin is warm and dry.  Vitals reviewed.  Radiology: No results found.  Laboratory examination:   05/24/2019: Cholesterol 148, triglycerides 166, HDL 44, LDL 76.   11/25/2017-cholesterol-140, HDL-45, LDL-72, triglycerides-117. Borderline elevated liver enzymes-SGOT-46 (ULN-40), SGPT-39 (ULN- 32).  No results for input(s): NA, K, CL, CO2, GLUCOSE, BUN, CREATININE, CALCIUM, GFRNONAA, GFRAA in the last 8760 hours. CMP 10/26/2007 10/25/2007 10/18/2007  Glucose 150(H) 159(H) 123(H)  BUN 7 6 15   Creatinine 0.80 0.80 0.79  Sodium 137 134(L) 138  Potassium 3.5 4.1 SLIGHT HEMOLYSIS 4.3  Chloride 100 102 102  CO2 30 25 26   Calcium 8.8 8.5 9.8  Total Protein - - 7.7  Total Bilirubin - - 0.6  Alkaline Phos - - 118(H)  AST - - 25  ALT - - 26   CBC 01/10/2008 10/26/2007 10/25/2007  WBC - 8.5 7.9  Hemoglobin 13.7 POINT OF CARE RESULT 12.6 13.1  Hematocrit - 36.2 37.4  Platelets - 163 181 DELTA CHECK NOTED   Lipid Panel  No results found for: CHOL, TRIG, HDL, CHOLHDL, VLDL, LDLCALC, LDLDIRECT HEMOGLOBIN A1C Lab Results  Component Value Date   HGBA1C (H) 07/12/2007    7.0 (NOTE)   The ADA recommends the following therapeutic goals for glycemic   control related to Hgb A1C measurement:   Goal of Therapy:   < 7.0% Hgb A1C   Action Suggested:  > 8.0% Hgb A1C   Ref:  Diabetes Care, 22, Suppl. 1,  1999   MPG 172 07/12/2007   TSH No results for input(s): TSH in the last 8760 hours. Medications   Current Outpatient Medications  Medication Instructions  . amLODipine (NORVASC) 5 mg, Oral, Daily  . amoxicillin (AMOXIL) 500 MG tablet Prn prior to dental  . aspirin EC 81 mg, Oral, Daily  . atorvastatin (LIPITOR) 10 mg, Oral, Daily  . calcium citrate-vitamin D (CITRACAL+D) 315-200 MG-UNIT tablet 2 tablets, Oral, 2 times daily  . colestipol (COLESTID) 2 g, Oral, 2 times daily  . diclofenac sodium (VOLTAREN) 1 % GEL Topical, As needed  . glimepiride (AMARYL) 4 mg, Oral, Daily with breakfast  . lisinopril (ZESTRIL)  40 mg, Oral, Daily  . metFORMIN (GLUCOPHAGE) 1,000 mg, Oral, 2 times daily with meals    Cardiac Studies:   Echocardiogram 05/22/2019: Left ventricle cavity is normal in size. Mild concentric hypertrophy of the left ventricle. Normal LV systolic function with EF 67%. Normal global wall motion. Doppler evidence of grade I (impaired) diastolic dysfunction, normal LAP.  Trileaflet aortic valve with mild aortic valve leaflet calcification. Moderate aortic valve stenosis. Aortic valve mean gradient of 13 mmHg, Vmax of 2.5 m/s. Calculated aortic valve area by continuity equation is 1.1 cm. Moderate (Grade II) aortic regurgitation. Mild mitral valve leaflet thickening. Mild mitral valve stenosis. Mean PG 3 mmHg, MVA 2.6 cm2 by PHT method, at HR 73 bpm.  No mitral valve regurgitation noted. IVC is normal with blunted respiratory response. Estimated RA pressure 8 mmHg. No significant change compared to previous study on 03/30/2017.  Carotid artery duplex  05/05/2019: Stenosis in the right internal carotid artery (16-49%). Stenosis in the right external carotid artery (>50%). Stenosis in the left internal carotid artery (50-69%), upper end os spectrum with ICA/CCA ratio of 3.37. Antegrade right vertebral artery flow. Antegrade left vertebral artery flow. Follow up in six months is  appropriate if clinically indicated. Compared to the study done on 04/05/2018, no significant change.  Assessment     ICD-10-CM   1. 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 Primary hypertension  I10   4. Mild aortic stenosis  I35.0   5. Hypercholesteremia  E78.00     EKG 04/05/2019: Sinus rhythm with first-degree AV block at rate of 77 bpm, left axis deviation, left anterior fascicular block.  Right bundle branch block.  Trifascicular block.  Poor R-wave progression, cannot exclude anteroseptal infarct old.  Low-voltage complexes, pulmonary disease pattern. No significant change from 2019.  Recommendations:   Kristin Roberts  is a 73 y.o. with hypertension, trifascicular block on EKG, hyperlipidemia, hepatic steatosis, type 2 diabetes, trace aortic stenosis and asymptomatic bilateral carotid stenosis here for 1 year follow up.   Except for arthritis, she denies any chest pain or shortness of breath and no clinical evidence of heart failure.  EKG shows persistent trifascicular block unchanged from prior EKG.  I have discussed with her regarding potential progression to high degree AV block.  We'll continue to monitor this on an annual basis unless she has dizziness or syncope.  Blood pressure is well controlled, I reviewed her labs from PCP, lipids are also well controlled.  Carotid artery stenosis is moderate, continued primary prevention.  I'll see her back in one year or sooner if problems.  We'll continue carotid surveillance.  Adrian Prows, MD, [facility] 06/05/2019, 2:53 PM [facility] [place] Pager: [phone] Office: [phone] If no answer Cell [phone]

## 2019-07-10 ENCOUNTER — Other Ambulatory Visit: Payer: Self-pay

## 2019-07-10 DIAGNOSIS — I10 Essential (primary) hypertension: Secondary | ICD-10-CM

## 2019-07-10 MED ORDER — AMLODIPINE BESYLATE 5 MG PO TABS: 5.0000 mg | ORAL_TABLET | Freq: Every day | ORAL | 3 refills | Status: DC

## 2019-08-21 ENCOUNTER — Ambulatory Visit: Payer: Medicare PPO | Attending: Internal Medicine

## 2019-08-21 DIAGNOSIS — Z23 Encounter for immunization: Secondary | ICD-10-CM | POA: Insufficient documentation

## 2019-08-21 NOTE — Progress Notes (Signed)
   Covid-19 Vaccination Clinic  Name:  Kristin Roberts    MRN: [id number] DOB: 03-01-1946  08/21/2019  [name] was observed post Covid-19 immunization for 15 minutes without incidence. She was provided with Vaccine Information Sheet and instruction to access the V-Safe system.   [name] was instructed to call 911 with any severe reactions post vaccine: Marland Kitchen Difficulty breathing  . Swelling of your face and throat  . A fast heartbeat  . A bad rash all over your body  . Dizziness and weakness    Immunizations Administered    Name Date Dose VIS Date Route   Pfizer COVID-19 Vaccine 08/21/2019  3:52 PM 0.3 mL 06/23/2019 Intramuscular   Manufacturer: [employer], [employer]   Lot: [id number]   NDC: [address]-0

## 2019-09-08 ENCOUNTER — Other Ambulatory Visit: Payer: Self-pay | Admitting: Internal Medicine

## 2019-09-08 DIAGNOSIS — Z1231 Encounter for screening mammogram for malignant neoplasm of breast: Secondary | ICD-10-CM

## 2019-09-15 ENCOUNTER — Ambulatory Visit: Payer: Medicare PPO | Attending: Internal Medicine

## 2019-09-15 DIAGNOSIS — Z23 Encounter for immunization: Secondary | ICD-10-CM | POA: Insufficient documentation

## 2019-09-15 NOTE — Progress Notes (Signed)
   Covid-19 Vaccination Clinic  Name:  KARAH CARUTHERS    MRN: [id number] DOB: 1946-03-08  09/15/2019  Ms. Broski was observed post Covid-19 immunization for 15 minutes without incident. She was provided with Vaccine Information Sheet and instruction to access the V-Safe system.   Ms. Horsley was instructed to call 911 with any severe reactions post vaccine: Marland Kitchen Difficulty breathing  . Swelling of face and throat  . A fast heartbeat  . A bad rash all over body  . Dizziness and weakness   Immunizations Administered    Name Date Dose VIS Date Route   Pfizer COVID-19 Vaccine 09/15/2019 12:18 PM 0.3 mL 06/23/2019 Intramuscular   Manufacturer: [employer], [employer]   Lot: [id number]   NDC: [address]-4

## 2019-10-11 ENCOUNTER — Ambulatory Visit
Admission: RE | Admit: 2019-10-11 | Discharge: 2019-10-11 | Disposition: A | Discharge status: Home / Self Care | Payer: Medicare PPO | Source: Ambulatory Visit | Attending: Internal Medicine | Admitting: Internal Medicine

## 2019-10-11 ENCOUNTER — Other Ambulatory Visit: Payer: Self-pay

## 2019-10-11 DIAGNOSIS — Z1231 Encounter for screening mammogram for malignant neoplasm of breast: Secondary | ICD-10-CM

## 2019-10-30 ENCOUNTER — Ambulatory Visit: Payer: Medicare PPO

## 2019-10-30 ENCOUNTER — Other Ambulatory Visit: Payer: Self-pay

## 2019-10-30 DIAGNOSIS — I6523 Occlusion and stenosis of bilateral carotid arteries: Secondary | ICD-10-CM

## 2019-11-05 ENCOUNTER — Other Ambulatory Visit: Payer: Self-pay | Admitting: Cardiology

## 2019-11-05 DIAGNOSIS — I6523 Occlusion and stenosis of bilateral carotid arteries: Secondary | ICD-10-CM

## 2019-12-05 DIAGNOSIS — E7800 Pure hypercholesterolemia, unspecified: Secondary | ICD-10-CM | POA: Diagnosis not present

## 2019-12-05 DIAGNOSIS — I10 Essential (primary) hypertension: Secondary | ICD-10-CM | POA: Diagnosis not present

## 2019-12-05 DIAGNOSIS — N390 Urinary tract infection, site not specified: Secondary | ICD-10-CM | POA: Diagnosis not present

## 2019-12-05 DIAGNOSIS — E119 Type 2 diabetes mellitus without complications: Secondary | ICD-10-CM | POA: Diagnosis not present

## 2019-12-05 DIAGNOSIS — E559 Vitamin D deficiency, unspecified: Secondary | ICD-10-CM | POA: Diagnosis not present

## 2019-12-13 DIAGNOSIS — H40013 Open angle with borderline findings, low risk, bilateral: Secondary | ICD-10-CM | POA: Diagnosis not present

## 2019-12-18 DIAGNOSIS — Z0000 Encounter for general adult medical examination without abnormal findings: Secondary | ICD-10-CM | POA: Diagnosis not present

## 2019-12-18 DIAGNOSIS — Z01419 Encounter for gynecological examination (general) (routine) without abnormal findings: Secondary | ICD-10-CM | POA: Diagnosis not present

## 2019-12-18 DIAGNOSIS — I10 Essential (primary) hypertension: Secondary | ICD-10-CM | POA: Diagnosis not present

## 2019-12-18 DIAGNOSIS — Z780 Asymptomatic menopausal state: Secondary | ICD-10-CM | POA: Diagnosis not present

## 2019-12-18 DIAGNOSIS — E7800 Pure hypercholesterolemia, unspecified: Secondary | ICD-10-CM | POA: Diagnosis not present

## 2019-12-18 DIAGNOSIS — E119 Type 2 diabetes mellitus without complications: Secondary | ICD-10-CM | POA: Diagnosis not present

## 2019-12-18 DIAGNOSIS — I6529 Occlusion and stenosis of unspecified carotid artery: Secondary | ICD-10-CM | POA: Diagnosis not present

## 2020-04-26 DIAGNOSIS — Z23 Encounter for immunization: Secondary | ICD-10-CM | POA: Diagnosis not present

## 2020-05-03 ENCOUNTER — Other Ambulatory Visit: Payer: Self-pay

## 2020-05-03 ENCOUNTER — Ambulatory Visit: Payer: Medicare PPO

## 2020-05-03 DIAGNOSIS — I6523 Occlusion and stenosis of bilateral carotid arteries: Secondary | ICD-10-CM

## 2020-05-04 ENCOUNTER — Other Ambulatory Visit: Payer: Self-pay | Admitting: Cardiology

## 2020-05-04 DIAGNOSIS — I6523 Occlusion and stenosis of bilateral carotid arteries: Secondary | ICD-10-CM

## 2020-06-05 ENCOUNTER — Ambulatory Visit: Payer: Medicare Other | Admitting: Cardiology

## 2020-06-11 DIAGNOSIS — E559 Vitamin D deficiency, unspecified: Secondary | ICD-10-CM | POA: Diagnosis not present

## 2020-06-11 DIAGNOSIS — I10 Essential (primary) hypertension: Secondary | ICD-10-CM | POA: Diagnosis not present

## 2020-06-11 DIAGNOSIS — E119 Type 2 diabetes mellitus without complications: Secondary | ICD-10-CM | POA: Diagnosis not present

## 2020-06-11 DIAGNOSIS — E7800 Pure hypercholesterolemia, unspecified: Secondary | ICD-10-CM | POA: Diagnosis not present

## 2020-06-19 DIAGNOSIS — H40013 Open angle with borderline findings, low risk, bilateral: Secondary | ICD-10-CM | POA: Diagnosis not present

## 2020-06-20 ENCOUNTER — Ambulatory Visit: Payer: Medicare PPO | Admitting: Cardiology

## 2020-06-20 ENCOUNTER — Encounter: Payer: Self-pay | Admitting: Cardiology

## 2020-06-20 ENCOUNTER — Other Ambulatory Visit: Payer: Self-pay

## 2020-06-20 VITALS — BP 140/66 | HR 76 | Resp 16 | Ht 63.0 in | Wt 203.2 lb

## 2020-06-20 DIAGNOSIS — I10 Essential (primary) hypertension: Secondary | ICD-10-CM | POA: Diagnosis not present

## 2020-06-20 DIAGNOSIS — Z6836 Body mass index (BMI) 36.0-36.9, adult: Secondary | ICD-10-CM

## 2020-06-20 DIAGNOSIS — I6523 Occlusion and stenosis of bilateral carotid arteries: Secondary | ICD-10-CM

## 2020-06-20 DIAGNOSIS — I453 Trifascicular block: Secondary | ICD-10-CM

## 2020-06-20 DIAGNOSIS — I351 Nonrheumatic aortic (valve) insufficiency: Secondary | ICD-10-CM | POA: Diagnosis not present

## 2020-06-20 NOTE — Progress Notes (Signed)
Primary Physician/Referring:  Pearson Grippe, MD  Patient ID: Kristin Roberts, female    DOB: 16-Jun-1946, 74 y.o.   MRN: [id number]  Chief Complaint  Patient presents with  . Aortic Stenosis  . Carotid Stenosis  . Follow-up    1 year   HPI:    HPI: Kristin Roberts  is a 74 y.o. with hypertension, hyperlipidemia, hepatic steatosis, type 2 diabetes, trace aortic stenosis, moderate aortic regurgitation and asymptomatic bilateral carotid stenosis here for 1 year follow up.   No symptoms of chest pain, dyspnea, palpitations, syncope, or symptoms suggestive of claudication or TIA. She has h/o hepatic steatosis and mildly abnormal liver enzymes. She is now on low dose Atorvastatin, liver enzymes have remained stable.  Except for bilateral knee pain she has no other specific complaints today.  She has lost about 3 pounds in weight since last office visit.  States that she records her blood pressure at home which is well controlled.  Past Medical History:  Diagnosis Date  . Diabetes mellitus without complication (HCC)   . High cholesterol   . Hypertension    Past Surgical History:  Procedure Laterality Date  . KNEE SURGERY     Social History   Tobacco Use  . Smoking status: Never Smoker  . Smokeless tobacco: Never Used  Substance Use Topics  . Alcohol use: No  Marital Status: Married   ROS  Review of Systems  Cardiovascular: Positive for claudication (night cramps). Negative for chest pain, dyspnea on exertion and leg swelling.  Musculoskeletal: Positive for joint pain (bilateral knee).  Gastrointestinal: Negative for melena.   Objective  Blood pressure 140/66, pulse 76, resp. rate 16, height 5\' 3"  (1.6 m), weight 203 lb 3.2 oz (92.2 kg), SpO2 97 %. Body mass index is 36 kg/m.  Vitals with BMI 06/20/2020 06/05/2019 02/15/2017  Height 5\' 3"  5\' 3"  -  Weight 203 lbs 3 oz 206 lbs 13 oz -  BMI 36 36.64 -  Systolic 140 136 04/17/2017  Diastolic 66 63 75  Pulse 76 82 85      Physical  Exam Vitals reviewed.  Constitutional:      Appearance: She is well-developed.     Comments: Moderately obese, concerned morbid in view of diabetes.  HENT:     Head: Normocephalic and atraumatic.  Cardiovascular:     Rate and Rhythm: Normal rate and regular rhythm.     Pulses: Intact distal pulses.          Carotid pulses are on the right side with bruit and on the left side with bruit.      Popliteal pulses are 2+ on the right side and 2+ on the left side.       Dorsalis pedis pulses are 2+ on the right side and 1+ on the left side.       Posterior tibial pulses are 2+ on the right side and 1+ on the left side.     Heart sounds: Murmur heard.   Early systolic murmur is present with a grade of 2/6 at the upper right sternal border radiating to the neck and lower right sternal border.     Comments: No JVD, no leg edema.  Pulmonary:     Effort: Pulmonary effort is normal. No accessory muscle usage or respiratory distress.     Breath sounds: Normal breath sounds.  Abdominal:     General: Bowel sounds are normal.     Palpations: Abdomen is soft.  Musculoskeletal:  General: Normal range of motion.     Cervical back: Normal range of motion.  Skin:    General: Skin is warm and dry.  Neurological:     Mental Status: She is alert and oriented to person, place, and time.    Radiology: No results found.  Laboratory examination:  External labs:   Cholesterol, total 148.000 m 05/23/2019 HDL 49.000 mg 06/11/2020 LDL-C 71.000 cal 12/05/2019 Triglycerides 100.000 m 12/05/2019  A1C 6.800 12/05/2019  Creatinine, Serum 1.230 MG/ 05/23/2019 ALT (SGPT) 50.000 IU/ 05/23/2019   05/24/2019: Cholesterol 148, triglycerides 166, HDL 44, LDL 76.   11/25/2017-cholesterol-140, HDL-45, LDL-72, triglycerides-117. Borderline elevated liver enzymes-SGOT-46 (ULN-40), SGPT-39 (ULN- 32).  Medications   Current Outpatient Medications  Medication Instructions  . amLODipine (NORVASC) 5 mg,  Oral, Daily  . amoxicillin (AMOXIL) 2,000 mg, Oral, Once PRN, Dental prophylaxis for knee replacement  . aspirin EC 81 mg, Oral, Daily  . atorvastatin (LIPITOR) 10 mg, Oral, Daily  . calcium citrate-vitamin D (CITRACAL+D) 315-200 MG-UNIT tablet 2 tablets, Oral, 2 times daily  . colestipol (COLESTID) 2 g, Oral, 2 times daily  . diclofenac sodium (VOLTAREN) 1 % GEL Topical, As needed  . glimepiride (AMARYL) 4 mg, Oral, Daily with breakfast  . lisinopril (ZESTRIL) 40 mg, Oral, Daily  . metFORMIN (GLUCOPHAGE) 1,000 mg, Oral, 2 times daily with meals    Cardiac Studies:   Echocardiogram 05/22/2019: Left ventricle cavity is normal in size. Mild concentric hypertrophy of the left ventricle. Normal LV systolic function with EF 67%. Normal global wall motion. Doppler evidence of grade I (impaired) diastolic dysfunction, normal LAP.  Trileaflet aortic valve with mild aortic valve leaflet calcification. Moderate aortic valve stenosis. Aortic valve mean gradient of 13 mmHg, Vmax of 2.5 m/s. Calculated aortic valve area by continuity equation is 1.1 cm. Moderate (Grade II) aortic regurgitation. Mild mitral valve leaflet thickening. Mild mitral valve stenosis. Mean PG 3 mmHg, MVA 2.6 cm2 by PHT method, at HR 73 bpm.  No mitral valve regurgitation noted. IVC is normal with blunted respiratory response. Estimated RA pressure 8 mmHg. No significant change compared to previous study on 03/30/2017.  Carotid artery duplex 05/03/2020: Stenosis in the right internal carotid artery (16-49%). Stenosis in the right external carotid artery (>50%). Stenosis in the left internal carotid artery (50-69%). Antegrade right vertebral artery flow. Antegrade left vertebral artery flow. Follow up in six months is appropriate if clinically indicated. NO significant change from 10/30/2019  EKG:   EKG 06/20/2020: Sinus rhythm with first-degree AV block at rate of 76 bpm, left axis deviation, left anterior fascicular block.   Right bundle branch block.  Trifascicular block.  Poor R wave progression, cannot exclude anterolateral infarct old.  Low-voltage complexes.  Pulmonary disease pattern.  No significant change from 04/05/2019.   Assessment     ICD-10-CM   1. 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 Primary hypertension  I10   4. Moderate aortic regurgitation  I35.1 PCV ECHOCARDIOGRAM COMPLETE  5. Class 2 severe obesity due to excess calories with serious comorbidity and body mass index (BMI) of 36.0 to 36.9 in adult [facility])  E66.01    Z68.36      Recommendations:   Kristin Roberts  is a 74 y.o. with hypertension, trifascicular block on EKG, hyperlipidemia, hepatic steatosis, type 2 diabetes, trace aortic stenosis, moderate aortic regurgitation and asymptomatic bilateral carotid stenosis here for 1 year follow up.   Except for arthritis,  she denies any chest pain or shortness of breath and no clinical evidence of heart failure.  EKG shows persistent trifascicular block unchanged from prior EKG.  I have discussed with her regarding potential progression to high degree AV block.  We'll continue to monitor this on an annual basis unless she has dizziness or syncope.  Blood pressure is well controlled, I reviewed her labs from PCP, lipids are also well controlled.  Carotid artery stenosis is moderate, continued primary prevention.  I reviewed the BMI with the patient, she would be considered morbidly obese in view of underlying diabetes mellitus.  She has lost 3 pounds in weight, encouraged her to lose more and to watch her calorie intake.  I'll see her back in one year or sooner if problems.     Yates Decamp, MD, [facility] 06/20/2020, 10:54 AM Office: [phone] Pager: [phone]

## 2020-06-24 DIAGNOSIS — Z96653 Presence of artificial knee joint, bilateral: Secondary | ICD-10-CM | POA: Diagnosis not present

## 2020-06-24 DIAGNOSIS — E559 Vitamin D deficiency, unspecified: Secondary | ICD-10-CM | POA: Diagnosis not present

## 2020-06-24 DIAGNOSIS — I10 Essential (primary) hypertension: Secondary | ICD-10-CM | POA: Diagnosis not present

## 2020-06-24 DIAGNOSIS — E119 Type 2 diabetes mellitus without complications: Secondary | ICD-10-CM | POA: Diagnosis not present

## 2020-06-24 DIAGNOSIS — E7800 Pure hypercholesterolemia, unspecified: Secondary | ICD-10-CM | POA: Diagnosis not present

## 2020-07-15 ENCOUNTER — Other Ambulatory Visit: Payer: Self-pay

## 2020-07-15 ENCOUNTER — Ambulatory Visit: Payer: Medicare PPO

## 2020-07-15 DIAGNOSIS — I351 Nonrheumatic aortic (valve) insufficiency: Secondary | ICD-10-CM

## 2020-07-25 ENCOUNTER — Other Ambulatory Visit: Payer: Self-pay | Admitting: Cardiology

## 2020-07-25 DIAGNOSIS — I10 Essential (primary) hypertension: Secondary | ICD-10-CM

## 2020-07-31 DIAGNOSIS — E119 Type 2 diabetes mellitus without complications: Secondary | ICD-10-CM | POA: Diagnosis not present

## 2020-07-31 DIAGNOSIS — H2513 Age-related nuclear cataract, bilateral: Secondary | ICD-10-CM | POA: Diagnosis not present

## 2020-07-31 DIAGNOSIS — H40013 Open angle with borderline findings, low risk, bilateral: Secondary | ICD-10-CM | POA: Diagnosis not present

## 2020-11-01 ENCOUNTER — Ambulatory Visit: Payer: Medicare PPO

## 2020-11-01 ENCOUNTER — Other Ambulatory Visit: Payer: Self-pay

## 2020-11-01 DIAGNOSIS — I6523 Occlusion and stenosis of bilateral carotid arteries: Secondary | ICD-10-CM | POA: Diagnosis not present

## 2020-11-03 NOTE — Progress Notes (Signed)
Mild disease right carotid and moderate disease left carotid, unchanged from prior study.  We will recheck in 6 months.  We will do this procedure prior to your next office visit with me in December 2022.

## 2020-12-16 DIAGNOSIS — E7800 Pure hypercholesterolemia, unspecified: Secondary | ICD-10-CM | POA: Diagnosis not present

## 2020-12-16 DIAGNOSIS — I10 Essential (primary) hypertension: Secondary | ICD-10-CM | POA: Diagnosis not present

## 2020-12-16 DIAGNOSIS — Z0000 Encounter for general adult medical examination without abnormal findings: Secondary | ICD-10-CM | POA: Diagnosis not present

## 2020-12-16 DIAGNOSIS — E559 Vitamin D deficiency, unspecified: Secondary | ICD-10-CM | POA: Diagnosis not present

## 2020-12-16 DIAGNOSIS — E119 Type 2 diabetes mellitus without complications: Secondary | ICD-10-CM | POA: Diagnosis not present

## 2020-12-16 DIAGNOSIS — M8000XG Age-related osteoporosis with current pathological fracture, unspecified site, subsequent encounter for fracture with delayed healing: Secondary | ICD-10-CM | POA: Diagnosis not present

## 2020-12-17 IMAGING — MG DIGITAL SCREENING BILAT W/ TOMO W/ CAD
6 of 10 series · 6 of 30 positions shown · non-contrast
Comparison: Previous exam(s).

CLINICAL DATA: Screening.

EXAM:
DIGITAL SCREENING BILATERAL MAMMOGRAM WITH TOMO AND CAD

[R MLO synth-2D]
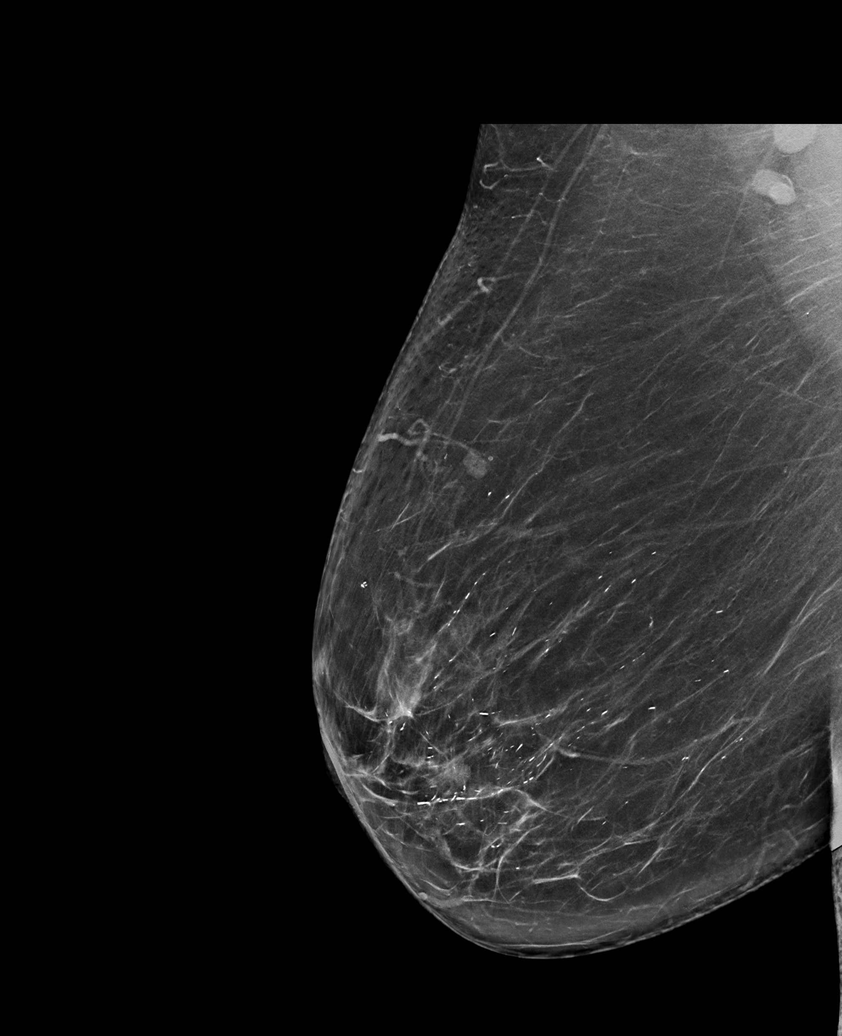

[R CC synth-2D]
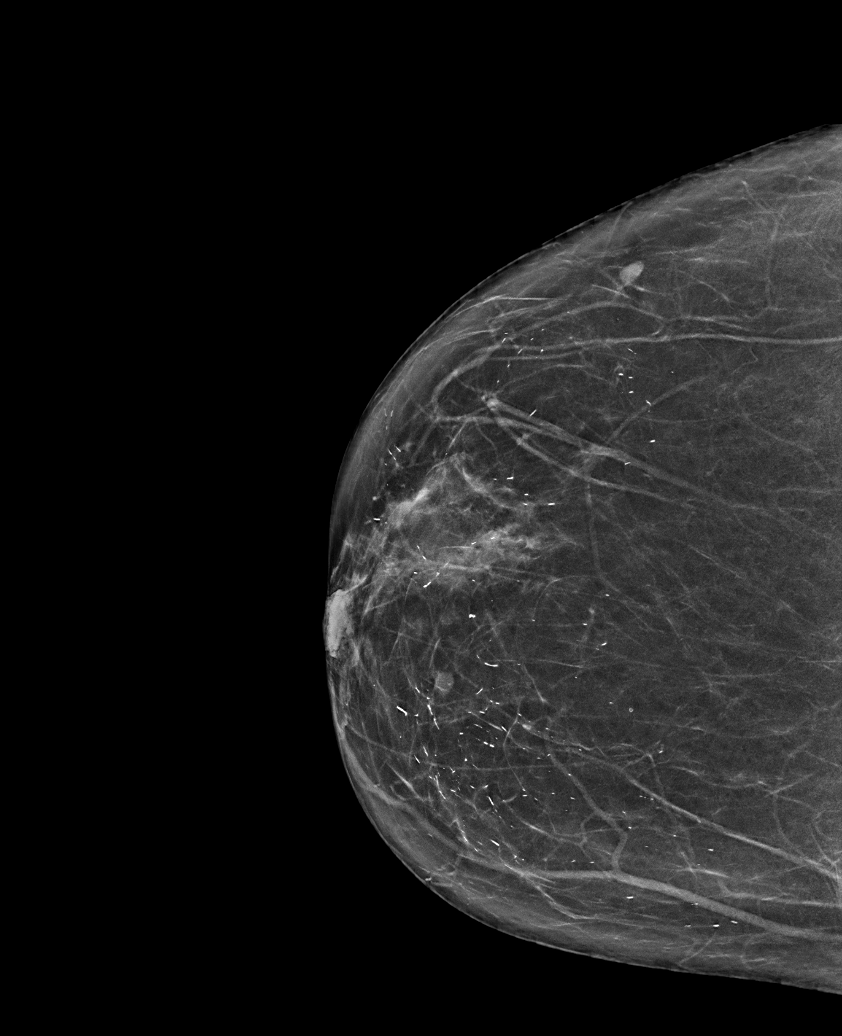

[L CC synth-2D]
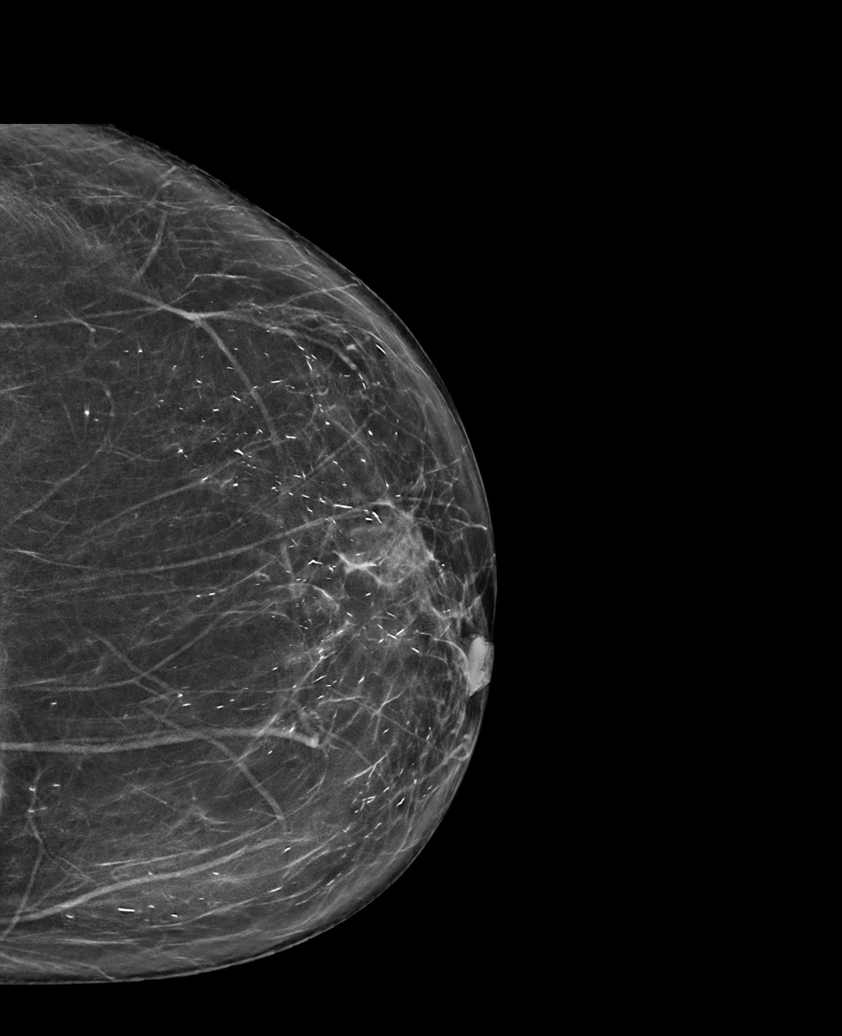

[L MLO synth-2D (1 of 2)]
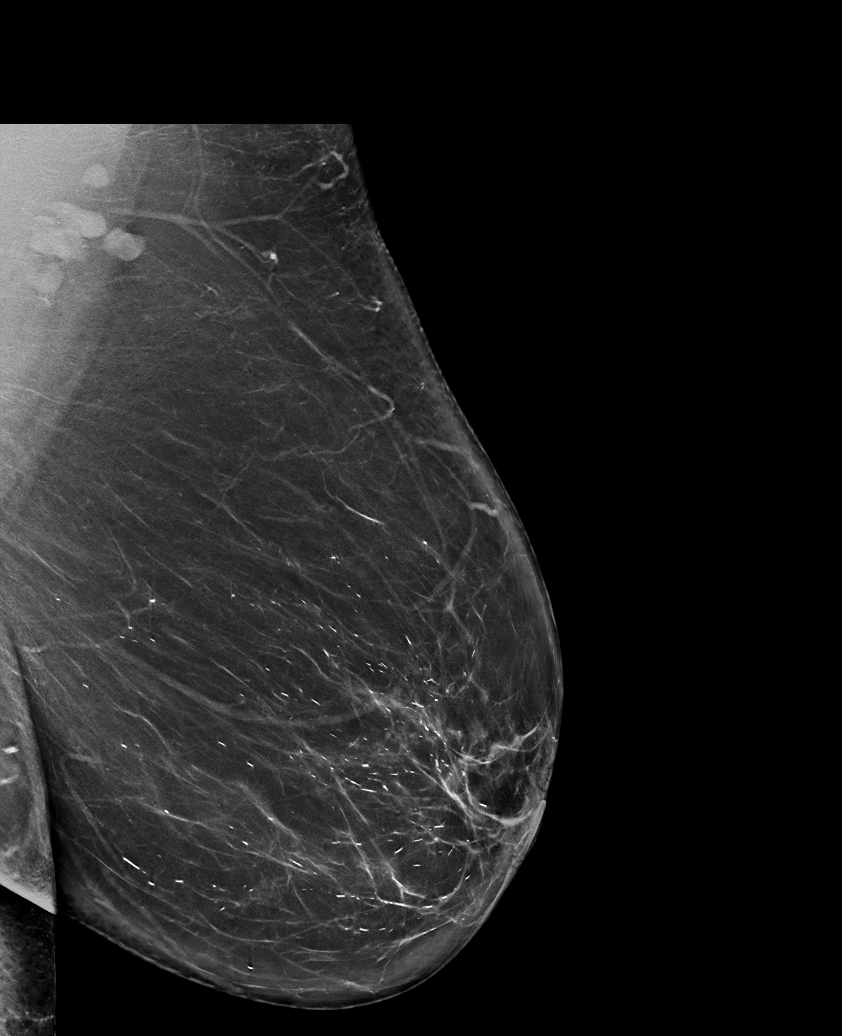

[L MLO synth-2D (2 of 2)]
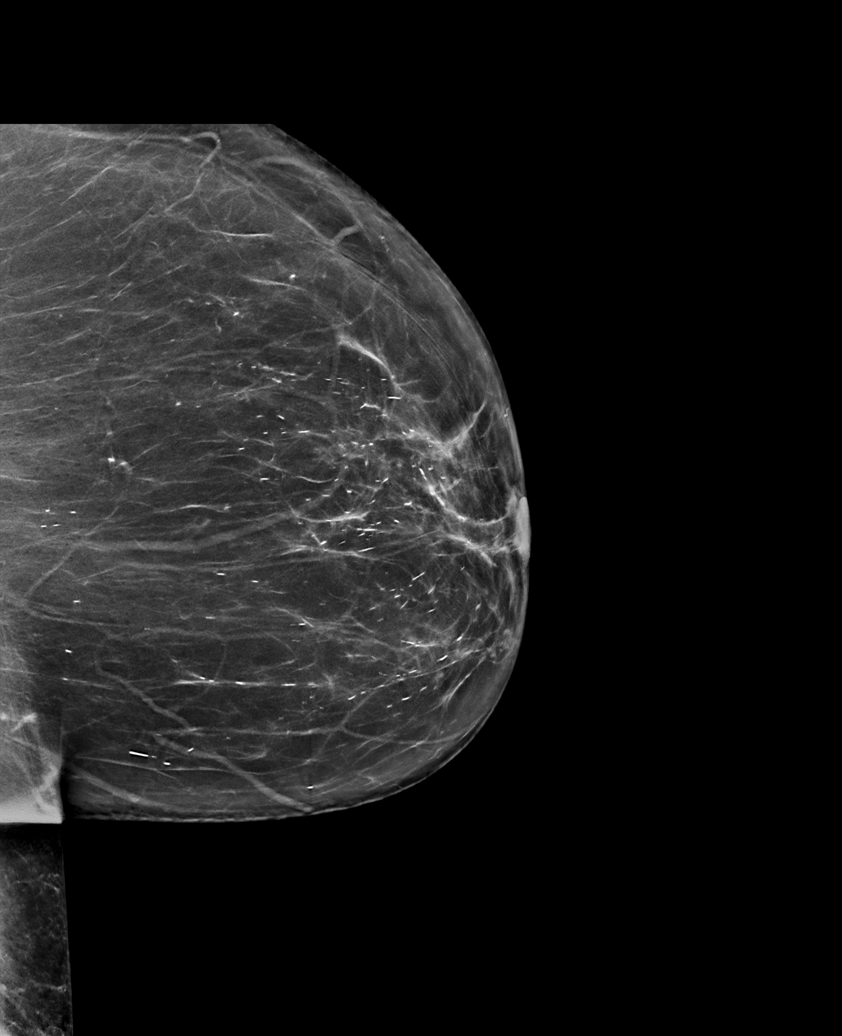

[L MLO tomo · tomo slice 47/92.0]
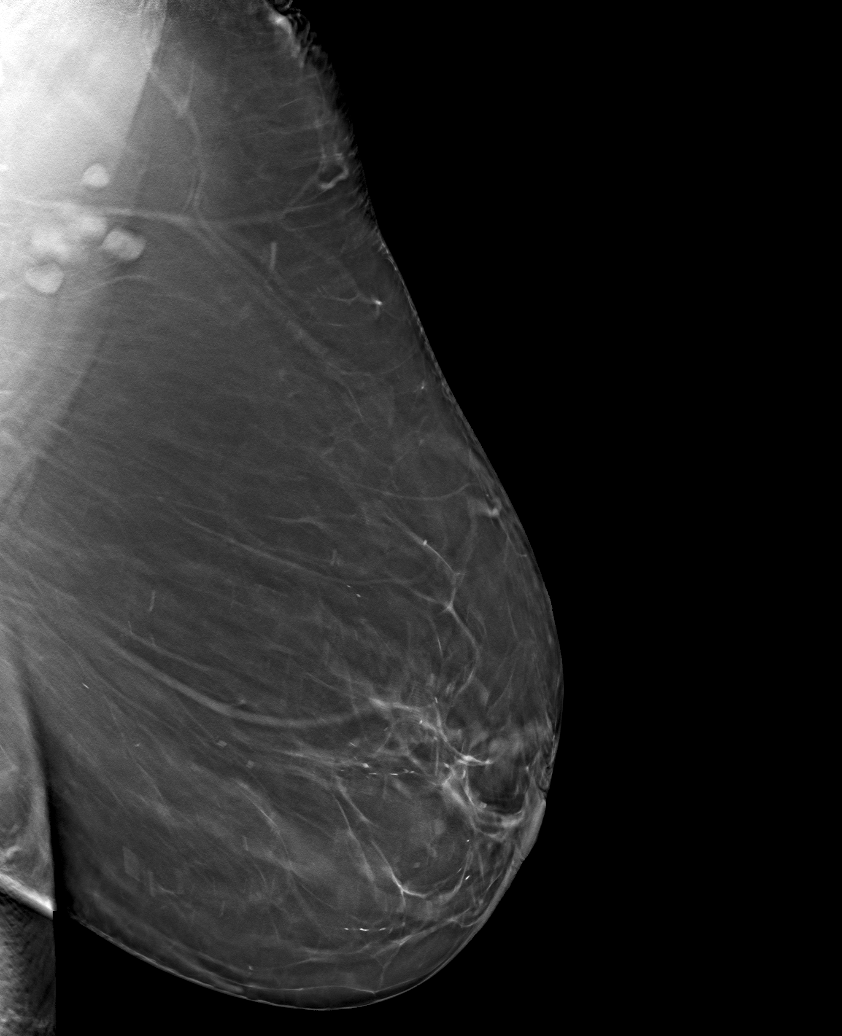

[6 of 30 positions shown; findings below may reference images not displayed]

ACR Breast Density Category b: There are scattered areas of
fibroglandular density.
FINDINGS: There are no findings suspicious for malignancy. Images were
processed with CAD.
IMPRESSION: No mammographic evidence of malignancy. A result letter of this
screening mammogram will be mailed directly to the patient.

RECOMMENDATION:
Screening mammogram in one year. (Code:[id number])

BI-RADS CATEGORY  1: Negative.

## 2021-05-26 DIAGNOSIS — Z23 Encounter for immunization: Secondary | ICD-10-CM | POA: Diagnosis not present

## 2021-05-29 ENCOUNTER — Ambulatory Visit: Payer: Medicare PPO

## 2021-05-29 ENCOUNTER — Other Ambulatory Visit: Payer: Self-pay

## 2021-05-29 DIAGNOSIS — I6523 Occlusion and stenosis of bilateral carotid arteries: Secondary | ICD-10-CM

## 2021-06-02 NOTE — Progress Notes (Signed)
Carotid artery duplex 05/29/2021:  Duplex suggests stenosis in the right internal carotid artery (16-49%). Duplex suggests stenosis in the right external carotid artery (>50%). Duplex suggests stenosis in the left internal carotid artery (50-69%). Antegrade right vertebral artery flow. Antegrade left vertebral artery flow. No significant change compared to 11/01/2020. Follow up in six months is appropriate if clinically indicated. Difficult study due to patient being scanned sitting.   Discuss on OV and order recheck in 6 months

## 2021-06-20 ENCOUNTER — Ambulatory Visit: Payer: Medicare PPO | Admitting: Cardiology

## 2021-08-19 DIAGNOSIS — E119 Type 2 diabetes mellitus without complications: Secondary | ICD-10-CM | POA: Diagnosis not present

## 2021-08-19 DIAGNOSIS — H25011 Cortical age-related cataract, right eye: Secondary | ICD-10-CM | POA: Diagnosis not present

## 2021-08-19 DIAGNOSIS — H40013 Open angle with borderline findings, low risk, bilateral: Secondary | ICD-10-CM | POA: Diagnosis not present

## 2021-08-19 DIAGNOSIS — H2513 Age-related nuclear cataract, bilateral: Secondary | ICD-10-CM | POA: Diagnosis not present

## 2021-08-26 DIAGNOSIS — E559 Vitamin D deficiency, unspecified: Secondary | ICD-10-CM | POA: Diagnosis not present

## 2021-08-26 DIAGNOSIS — E7800 Pure hypercholesterolemia, unspecified: Secondary | ICD-10-CM | POA: Diagnosis not present

## 2021-08-26 DIAGNOSIS — R413 Other amnesia: Secondary | ICD-10-CM | POA: Diagnosis not present

## 2021-08-26 DIAGNOSIS — E119 Type 2 diabetes mellitus without complications: Secondary | ICD-10-CM | POA: Diagnosis not present

## 2021-08-26 DIAGNOSIS — R011 Cardiac murmur, unspecified: Secondary | ICD-10-CM | POA: Diagnosis not present

## 2021-08-26 DIAGNOSIS — I10 Essential (primary) hypertension: Secondary | ICD-10-CM | POA: Diagnosis not present

## 2021-10-10 ENCOUNTER — Other Ambulatory Visit: Payer: Self-pay | Admitting: Cardiology

## 2021-10-10 DIAGNOSIS — I10 Essential (primary) hypertension: Secondary | ICD-10-CM

## 2022-02-17 DIAGNOSIS — E559 Vitamin D deficiency, unspecified: Secondary | ICD-10-CM | POA: Diagnosis not present

## 2022-02-17 DIAGNOSIS — E7800 Pure hypercholesterolemia, unspecified: Secondary | ICD-10-CM | POA: Diagnosis not present

## 2022-02-17 DIAGNOSIS — E119 Type 2 diabetes mellitus without complications: Secondary | ICD-10-CM | POA: Diagnosis not present

## 2022-02-19 DIAGNOSIS — H40013 Open angle with borderline findings, low risk, bilateral: Secondary | ICD-10-CM | POA: Diagnosis not present

## 2022-02-24 ENCOUNTER — Other Ambulatory Visit: Payer: Self-pay

## 2022-02-24 DIAGNOSIS — E7800 Pure hypercholesterolemia, unspecified: Secondary | ICD-10-CM | POA: Diagnosis not present

## 2022-02-24 DIAGNOSIS — Z0000 Encounter for general adult medical examination without abnormal findings: Secondary | ICD-10-CM | POA: Diagnosis not present

## 2022-02-24 DIAGNOSIS — Z1231 Encounter for screening mammogram for malignant neoplasm of breast: Secondary | ICD-10-CM

## 2022-02-24 DIAGNOSIS — N1832 Chronic kidney disease, stage 3b: Secondary | ICD-10-CM | POA: Diagnosis not present

## 2022-02-24 DIAGNOSIS — R011 Cardiac murmur, unspecified: Secondary | ICD-10-CM | POA: Diagnosis not present

## 2022-02-24 DIAGNOSIS — K7689 Other specified diseases of liver: Secondary | ICD-10-CM | POA: Diagnosis not present

## 2022-02-24 DIAGNOSIS — R413 Other amnesia: Secondary | ICD-10-CM | POA: Diagnosis not present

## 2022-02-24 DIAGNOSIS — E1122 Type 2 diabetes mellitus with diabetic chronic kidney disease: Secondary | ICD-10-CM | POA: Diagnosis not present

## 2022-02-24 DIAGNOSIS — I10 Essential (primary) hypertension: Secondary | ICD-10-CM | POA: Diagnosis not present

## 2022-02-24 DIAGNOSIS — E559 Vitamin D deficiency, unspecified: Secondary | ICD-10-CM | POA: Diagnosis not present

## 2022-03-02 DIAGNOSIS — Z1212 Encounter for screening for malignant neoplasm of rectum: Secondary | ICD-10-CM | POA: Diagnosis not present

## 2022-03-02 DIAGNOSIS — Z1211 Encounter for screening for malignant neoplasm of colon: Secondary | ICD-10-CM | POA: Diagnosis not present

## 2022-03-10 LAB — COLOGUARD: COLOGUARD: NEGATIVE

## 2022-03-11 ENCOUNTER — Ambulatory Visit
Admission: RE | Admit: 2022-03-11 | Discharge: 2022-03-11 | Disposition: A | Discharge status: Home / Self Care | Payer: Medicare PPO | Source: Ambulatory Visit

## 2022-03-11 DIAGNOSIS — Z1231 Encounter for screening mammogram for malignant neoplasm of breast: Secondary | ICD-10-CM | POA: Diagnosis not present

## 2022-08-21 DIAGNOSIS — R413 Other amnesia: Secondary | ICD-10-CM | POA: Diagnosis not present

## 2022-08-21 DIAGNOSIS — I10 Essential (primary) hypertension: Secondary | ICD-10-CM | POA: Diagnosis not present

## 2022-08-21 DIAGNOSIS — N1832 Chronic kidney disease, stage 3b: Secondary | ICD-10-CM | POA: Diagnosis not present

## 2022-08-21 DIAGNOSIS — E559 Vitamin D deficiency, unspecified: Secondary | ICD-10-CM | POA: Diagnosis not present

## 2022-08-21 DIAGNOSIS — E1122 Type 2 diabetes mellitus with diabetic chronic kidney disease: Secondary | ICD-10-CM | POA: Diagnosis not present

## 2022-08-21 DIAGNOSIS — E7800 Pure hypercholesterolemia, unspecified: Secondary | ICD-10-CM | POA: Diagnosis not present

## 2022-08-27 DIAGNOSIS — Z23 Encounter for immunization: Secondary | ICD-10-CM | POA: Diagnosis not present

## 2022-08-27 DIAGNOSIS — E559 Vitamin D deficiency, unspecified: Secondary | ICD-10-CM | POA: Diagnosis not present

## 2022-08-27 DIAGNOSIS — E1122 Type 2 diabetes mellitus with diabetic chronic kidney disease: Secondary | ICD-10-CM | POA: Diagnosis not present

## 2022-08-27 DIAGNOSIS — N1832 Chronic kidney disease, stage 3b: Secondary | ICD-10-CM | POA: Diagnosis not present

## 2022-08-27 DIAGNOSIS — I10 Essential (primary) hypertension: Secondary | ICD-10-CM | POA: Diagnosis not present

## 2022-08-27 DIAGNOSIS — R413 Other amnesia: Secondary | ICD-10-CM | POA: Diagnosis not present

## 2022-08-27 DIAGNOSIS — K7689 Other specified diseases of liver: Secondary | ICD-10-CM | POA: Diagnosis not present

## 2022-08-27 DIAGNOSIS — R011 Cardiac murmur, unspecified: Secondary | ICD-10-CM | POA: Diagnosis not present

## 2022-08-27 DIAGNOSIS — E7800 Pure hypercholesterolemia, unspecified: Secondary | ICD-10-CM | POA: Diagnosis not present

## 2022-10-31 ENCOUNTER — Other Ambulatory Visit: Payer: Self-pay | Admitting: Cardiology

## 2022-10-31 DIAGNOSIS — I10 Essential (primary) hypertension: Secondary | ICD-10-CM

## 2023-01-20 ENCOUNTER — Other Ambulatory Visit: Payer: Self-pay | Admitting: Family Medicine

## 2023-01-20 DIAGNOSIS — Z1231 Encounter for screening mammogram for malignant neoplasm of breast: Secondary | ICD-10-CM

## 2023-02-22 DIAGNOSIS — E1122 Type 2 diabetes mellitus with diabetic chronic kidney disease: Secondary | ICD-10-CM | POA: Diagnosis not present

## 2023-02-22 DIAGNOSIS — N1832 Chronic kidney disease, stage 3b: Secondary | ICD-10-CM | POA: Diagnosis not present

## 2023-02-22 DIAGNOSIS — R011 Cardiac murmur, unspecified: Secondary | ICD-10-CM | POA: Diagnosis not present

## 2023-02-22 DIAGNOSIS — I10 Essential (primary) hypertension: Secondary | ICD-10-CM | POA: Diagnosis not present

## 2023-02-22 DIAGNOSIS — R413 Other amnesia: Secondary | ICD-10-CM | POA: Diagnosis not present

## 2023-02-22 DIAGNOSIS — E7800 Pure hypercholesterolemia, unspecified: Secondary | ICD-10-CM | POA: Diagnosis not present

## 2023-02-22 DIAGNOSIS — E559 Vitamin D deficiency, unspecified: Secondary | ICD-10-CM | POA: Diagnosis not present

## 2023-03-01 DIAGNOSIS — N1832 Chronic kidney disease, stage 3b: Secondary | ICD-10-CM | POA: Diagnosis not present

## 2023-03-01 DIAGNOSIS — E7800 Pure hypercholesterolemia, unspecified: Secondary | ICD-10-CM | POA: Diagnosis not present

## 2023-03-01 DIAGNOSIS — I10 Essential (primary) hypertension: Secondary | ICD-10-CM | POA: Diagnosis not present

## 2023-03-01 DIAGNOSIS — R011 Cardiac murmur, unspecified: Secondary | ICD-10-CM | POA: Diagnosis not present

## 2023-03-01 DIAGNOSIS — R21 Rash and other nonspecific skin eruption: Secondary | ICD-10-CM | POA: Diagnosis not present

## 2023-03-01 DIAGNOSIS — R413 Other amnesia: Secondary | ICD-10-CM | POA: Diagnosis not present

## 2023-03-01 DIAGNOSIS — Z0000 Encounter for general adult medical examination without abnormal findings: Secondary | ICD-10-CM | POA: Diagnosis not present

## 2023-03-01 DIAGNOSIS — E559 Vitamin D deficiency, unspecified: Secondary | ICD-10-CM | POA: Diagnosis not present

## 2023-03-01 DIAGNOSIS — E1122 Type 2 diabetes mellitus with diabetic chronic kidney disease: Secondary | ICD-10-CM | POA: Diagnosis not present

## 2023-03-16 ENCOUNTER — Ambulatory Visit: Payer: Medicare PPO

## 2023-03-17 ENCOUNTER — Ambulatory Visit
Admission: RE | Admit: 2023-03-17 | Discharge: 2023-03-17 | Disposition: A | Discharge status: Home / Self Care | Payer: Medicare PPO | Source: Ambulatory Visit | Attending: Family Medicine | Admitting: Family Medicine

## 2023-03-17 DIAGNOSIS — Z1231 Encounter for screening mammogram for malignant neoplasm of breast: Secondary | ICD-10-CM

## 2023-03-31 DIAGNOSIS — Z23 Encounter for immunization: Secondary | ICD-10-CM | POA: Diagnosis not present

## 2023-08-25 ENCOUNTER — Other Ambulatory Visit: Payer: Self-pay | Admitting: Cardiology

## 2023-08-25 DIAGNOSIS — I10 Essential (primary) hypertension: Secondary | ICD-10-CM

## 2023-08-26 DIAGNOSIS — N1832 Chronic kidney disease, stage 3b: Secondary | ICD-10-CM | POA: Diagnosis not present

## 2023-08-26 DIAGNOSIS — E7800 Pure hypercholesterolemia, unspecified: Secondary | ICD-10-CM | POA: Diagnosis not present

## 2023-08-26 DIAGNOSIS — E1122 Type 2 diabetes mellitus with diabetic chronic kidney disease: Secondary | ICD-10-CM | POA: Diagnosis not present

## 2023-09-03 DIAGNOSIS — E7800 Pure hypercholesterolemia, unspecified: Secondary | ICD-10-CM | POA: Diagnosis not present

## 2023-09-03 DIAGNOSIS — R413 Other amnesia: Secondary | ICD-10-CM | POA: Diagnosis not present

## 2023-09-03 DIAGNOSIS — K7689 Other specified diseases of liver: Secondary | ICD-10-CM | POA: Diagnosis not present

## 2023-09-03 DIAGNOSIS — N1832 Chronic kidney disease, stage 3b: Secondary | ICD-10-CM | POA: Diagnosis not present

## 2023-09-03 DIAGNOSIS — R011 Cardiac murmur, unspecified: Secondary | ICD-10-CM | POA: Diagnosis not present

## 2023-09-03 DIAGNOSIS — E1122 Type 2 diabetes mellitus with diabetic chronic kidney disease: Secondary | ICD-10-CM | POA: Diagnosis not present

## 2023-09-03 DIAGNOSIS — E559 Vitamin D deficiency, unspecified: Secondary | ICD-10-CM | POA: Diagnosis not present

## 2023-09-03 DIAGNOSIS — I10 Essential (primary) hypertension: Secondary | ICD-10-CM | POA: Diagnosis not present

## 2023-10-26 ENCOUNTER — Other Ambulatory Visit: Payer: Self-pay | Admitting: Cardiology

## 2023-10-26 DIAGNOSIS — I10 Essential (primary) hypertension: Secondary | ICD-10-CM

## 2023-11-04 ENCOUNTER — Other Ambulatory Visit: Payer: Self-pay | Admitting: Cardiology

## 2023-11-04 DIAGNOSIS — I10 Essential (primary) hypertension: Secondary | ICD-10-CM

## 2023-11-10 DIAGNOSIS — M25511 Pain in right shoulder: Secondary | ICD-10-CM | POA: Diagnosis not present

## 2023-11-10 DIAGNOSIS — S8002XA Contusion of left knee, initial encounter: Secondary | ICD-10-CM | POA: Diagnosis not present

## 2023-11-10 DIAGNOSIS — Z9181 History of falling: Secondary | ICD-10-CM | POA: Diagnosis not present

## 2023-11-10 DIAGNOSIS — R42 Dizziness and giddiness: Secondary | ICD-10-CM | POA: Diagnosis not present

## 2023-11-21 ENCOUNTER — Other Ambulatory Visit: Payer: Self-pay | Admitting: Cardiology

## 2023-11-21 DIAGNOSIS — I10 Essential (primary) hypertension: Secondary | ICD-10-CM

## 2023-11-25 DIAGNOSIS — E7800 Pure hypercholesterolemia, unspecified: Secondary | ICD-10-CM | POA: Diagnosis not present

## 2023-11-25 DIAGNOSIS — N1832 Chronic kidney disease, stage 3b: Secondary | ICD-10-CM | POA: Diagnosis not present

## 2023-11-25 DIAGNOSIS — E1122 Type 2 diabetes mellitus with diabetic chronic kidney disease: Secondary | ICD-10-CM | POA: Diagnosis not present

## 2023-12-02 DIAGNOSIS — E7800 Pure hypercholesterolemia, unspecified: Secondary | ICD-10-CM | POA: Diagnosis not present

## 2023-12-02 DIAGNOSIS — E559 Vitamin D deficiency, unspecified: Secondary | ICD-10-CM | POA: Diagnosis not present

## 2023-12-02 DIAGNOSIS — E1122 Type 2 diabetes mellitus with diabetic chronic kidney disease: Secondary | ICD-10-CM | POA: Diagnosis not present

## 2023-12-02 DIAGNOSIS — R413 Other amnesia: Secondary | ICD-10-CM | POA: Diagnosis not present

## 2023-12-02 DIAGNOSIS — N1832 Chronic kidney disease, stage 3b: Secondary | ICD-10-CM | POA: Diagnosis not present

## 2023-12-02 DIAGNOSIS — K7689 Other specified diseases of liver: Secondary | ICD-10-CM | POA: Diagnosis not present

## 2023-12-02 DIAGNOSIS — I10 Essential (primary) hypertension: Secondary | ICD-10-CM | POA: Diagnosis not present

## 2023-12-02 DIAGNOSIS — R011 Cardiac murmur, unspecified: Secondary | ICD-10-CM | POA: Diagnosis not present

## 2023-12-08 ENCOUNTER — Other Ambulatory Visit: Payer: Self-pay | Admitting: Cardiology

## 2023-12-08 DIAGNOSIS — I10 Essential (primary) hypertension: Secondary | ICD-10-CM

## 2023-12-09 NOTE — Telephone Encounter (Signed)
 Pt of Dr. Berry Bristol. Last seen on 06/20/20. She has had 3 attempts. Does Dr. Berry Bristol want to refill? Please advise

## 2023-12-09 NOTE — Telephone Encounter (Signed)
 Left message for patient to call back

## 2023-12-10 NOTE — Telephone Encounter (Signed)
 Left message for patient to call office.

## 2024-01-05 NOTE — Telephone Encounter (Signed)
 I spoke with patient and scheduled her to see Dr Ladona on October 3 at 11:20.  Refill sent to pharmacy.  Patient aware she needs to keep appointment for additional refills

## 2024-02-22 ENCOUNTER — Other Ambulatory Visit: Payer: Self-pay

## 2024-02-22 DIAGNOSIS — Z1231 Encounter for screening mammogram for malignant neoplasm of breast: Secondary | ICD-10-CM

## 2024-02-23 DIAGNOSIS — E559 Vitamin D deficiency, unspecified: Secondary | ICD-10-CM | POA: Diagnosis not present

## 2024-02-23 DIAGNOSIS — N1832 Chronic kidney disease, stage 3b: Secondary | ICD-10-CM | POA: Diagnosis not present

## 2024-02-23 DIAGNOSIS — E1122 Type 2 diabetes mellitus with diabetic chronic kidney disease: Secondary | ICD-10-CM | POA: Diagnosis not present

## 2024-02-23 DIAGNOSIS — E7800 Pure hypercholesterolemia, unspecified: Secondary | ICD-10-CM | POA: Diagnosis not present

## 2024-03-01 DIAGNOSIS — N1832 Chronic kidney disease, stage 3b: Secondary | ICD-10-CM | POA: Diagnosis not present

## 2024-03-01 DIAGNOSIS — R413 Other amnesia: Secondary | ICD-10-CM | POA: Diagnosis not present

## 2024-03-01 DIAGNOSIS — I10 Essential (primary) hypertension: Secondary | ICD-10-CM | POA: Diagnosis not present

## 2024-03-01 DIAGNOSIS — E1122 Type 2 diabetes mellitus with diabetic chronic kidney disease: Secondary | ICD-10-CM | POA: Diagnosis not present

## 2024-03-01 DIAGNOSIS — E7800 Pure hypercholesterolemia, unspecified: Secondary | ICD-10-CM | POA: Diagnosis not present

## 2024-03-01 DIAGNOSIS — R011 Cardiac murmur, unspecified: Secondary | ICD-10-CM | POA: Diagnosis not present

## 2024-03-01 DIAGNOSIS — R0989 Other specified symptoms and signs involving the circulatory and respiratory systems: Secondary | ICD-10-CM | POA: Diagnosis not present

## 2024-03-01 DIAGNOSIS — E559 Vitamin D deficiency, unspecified: Secondary | ICD-10-CM | POA: Diagnosis not present

## 2024-03-01 DIAGNOSIS — Z0000 Encounter for general adult medical examination without abnormal findings: Secondary | ICD-10-CM | POA: Diagnosis not present

## 2024-03-27 ENCOUNTER — Ambulatory Visit
Admission: RE | Admit: 2024-03-27 | Discharge: 2024-03-27 | Disposition: A | Discharge status: Home / Self Care | Source: Ambulatory Visit

## 2024-03-27 DIAGNOSIS — Z1231 Encounter for screening mammogram for malignant neoplasm of breast: Secondary | ICD-10-CM | POA: Diagnosis not present

## 2024-04-03 DIAGNOSIS — Z23 Encounter for immunization: Secondary | ICD-10-CM | POA: Diagnosis not present

## 2024-04-09 ENCOUNTER — Other Ambulatory Visit: Payer: Self-pay | Admitting: Cardiology

## 2024-04-09 DIAGNOSIS — I10 Essential (primary) hypertension: Secondary | ICD-10-CM

## 2024-04-11 DIAGNOSIS — Z96652 Presence of left artificial knee joint: Secondary | ICD-10-CM | POA: Diagnosis not present

## 2024-04-11 DIAGNOSIS — M25362 Other instability, left knee: Secondary | ICD-10-CM | POA: Diagnosis not present

## 2024-04-14 ENCOUNTER — Encounter: Payer: Self-pay | Admitting: Cardiology

## 2024-04-14 ENCOUNTER — Other Ambulatory Visit: Payer: Self-pay

## 2024-04-14 ENCOUNTER — Ambulatory Visit: Attending: Cardiology | Admitting: Cardiology

## 2024-04-14 ENCOUNTER — Other Ambulatory Visit (HOSPITAL_COMMUNITY): Payer: Self-pay

## 2024-04-14 VITALS — BP 124/74 | HR 76 | Resp 16 | Ht 63.0 in | Wt 196.7 lb

## 2024-04-14 DIAGNOSIS — E7800 Pure hypercholesterolemia, unspecified: Secondary | ICD-10-CM | POA: Diagnosis not present

## 2024-04-14 DIAGNOSIS — I6523 Occlusion and stenosis of bilateral carotid arteries: Secondary | ICD-10-CM

## 2024-04-14 DIAGNOSIS — I359 Nonrheumatic aortic valve disorder, unspecified: Secondary | ICD-10-CM

## 2024-04-14 DIAGNOSIS — I10 Essential (primary) hypertension: Secondary | ICD-10-CM

## 2024-04-14 DIAGNOSIS — Z79899 Other long term (current) drug therapy: Secondary | ICD-10-CM

## 2024-04-14 DIAGNOSIS — R0989 Other specified symptoms and signs involving the circulatory and respiratory systems: Secondary | ICD-10-CM

## 2024-04-14 MED ORDER — EZETIMIBE 10 MG PO TABS
10.0000 mg | ORAL_TABLET | Freq: Every day | ORAL | 3 refills | Status: AC
  Filled 2024-04-14 (×2): qty 90, 90d supply, fill #0

## 2024-04-14 NOTE — Progress Notes (Signed)
 Cardiology Office Note:  .   Date:  04/15/2024  ID:  Kristin Roberts, DOB 17-Aug-1945, MRN [id number] PCP: Gerome Brunet, DO  [facility] Providers Cardiologist:  None   History of Present Illness: Kristin   Kristin Roberts is a 78 y.o. female patient with hypertension, hypercholesterolemia, obesity with hepatic steatosis, type 2 diabetes mellitus with stage IIIb chronic kidney disease, moderate aortic regurgitation by echocardiogram in 2020 and asymptomatic bilateral carotid artery stenosis by carotid duplex on 05/03/2020 who I had seen back in 2021 presents to reestablish care.  Cardiac Studies relevent.    Echocardiogram 07/15/2020: Left ventricle cavity is normal in size and wall thickness. Normal global wall motion. Normal LV systolic function with EF 57%. Doppler evidence of grade I (impaired) diastolic dysfunction, normal LAP. Trileaflet aortic valve with moderate aortic valve leaflet calcification. Mild aortic stenosis. Vmax 2.4 m/sec, mean PG 14 mmHg, AVA 1.3 cm2 by continuity equation. Moderate (Grade II) aortic regurgitation.  Carotid artery duplex 05/29/2021:  Duplex suggests stenosis in the right internal carotid artery (16-49%). Duplex suggests stenosis in the right external carotid artery (>50%).  Duplex suggests stenosis in the left internal carotid artery (50-69%).  Antegrade right vertebral artery flow. Antegrade left vertebral artery flow.  No significant change compared to 11/01/2020.     Discussed the use of AI scribe software for clinical note transcription with the patient, who gave verbal consent to proceed.  History of Present Illness Kristin Roberts is a 78 year old female with diabetes and chronic kidney disease who presents for a routine cardiovascular follow-up. She is accompanied by her husband.  She maintains an active lifestyle through daily activities such as shopping, household chores, and gardening, although she does not engage in structured  exercise like daily walking. She experiences no cramping or weakness in her legs during activity, and her legs do not feel like they are 'giving up'.  She is currently taking atorvastatin 10 mg for cholesterol management, along with aspirin. Her LDL cholesterol is 76 mg/dL. She has a history of muscle pain associated with statin use in the past.  She manages diabetes with glimepiride 4 mg, metformin 1000 mg, and Jardiance 10 mg daily. She also has chronic kidney disease. Her blood pressure is managed with amlodipine  5 mg and lisinopril 40 mg daily.  Labs   Care everywhere/Faxed External Labs:  Labs 02/24/2024:  Total cholesterol 155, triglycerides 129, HDL 56, LDL 76.  A1c 6.6%.  Serum glucose 129 mg, BUN 22, creatinine 1.19, eGFR 43 mL, potassium 4.8, LFTs normal.  Hb 14.3/HCT 46.0, platelets 283.  Labs 02/22/2023:  TSH normal at 2.720.  ROS  Review of Systems  Cardiovascular:  Negative for chest pain, dyspnea on exertion and leg swelling.   Physical Exam:   VS:  BP 124/74 (BP Location: Left Arm, Patient Position: Sitting, Cuff Size: Large)   Pulse 76   Resp 16   Ht 5' 3 (1.6 m)   Wt 196 lb 11.2 oz (89.2 kg)   SpO2 98%   BMI 34.84 kg/m    Wt Readings from Last 3 Encounters:  04/14/24 196 lb 11.2 oz (89.2 kg)  06/20/20 203 lb 3.2 oz (92.2 kg)  06/05/19 206 lb 12.8 oz (93.8 kg)    BP Readings from Last 3 Encounters:  04/14/24 124/74  06/20/20 140/66  06/05/19 136/63   Physical Exam Neck:     Vascular: No JVD.  Cardiovascular:     Rate and Rhythm: Normal rate and  regular rhythm.     Pulses:          Carotid pulses are  on the right side with bruit and  on the left side with bruit.      Popliteal pulses are 2+ on the right side and 2+ on the left side.       Dorsalis pedis pulses are 0 on the right side and 0 on the left side.       Posterior tibial pulses are 0 on the right side and 0 on the left side.     Heart sounds: S1 normal and S2 normal. Murmur heard.      Midsystolic murmur is present with a grade of 3/6 at the upper right sternal border and apex radiating to the apex.     No gallop.  Pulmonary:     Effort: Pulmonary effort is normal.     Breath sounds: Normal breath sounds.  Abdominal:     General: Bowel sounds are normal.     Palpations: Abdomen is soft.  Musculoskeletal:     Right lower leg: No edema.     Left lower leg: No edema.    EKG:    EKG Interpretation Date/Time:  Friday April 14 2024 11:27:40 EDT Ventricular Rate:  79 PR Interval:  198 QRS Duration:  126 QT Interval:  396 QTC Calculation: 454 R Axis:   -73  Text Interpretation: EKG 04/14/2024: Normal sinus rhythm with rate of 79 bpm (borderline PR interval 200 ms), left anterior fascicular block.  Right bundle branch block. Bifascicular block.  Poor R wave progression, cannot exclude anterolateral infarct old.  Compared to 06/20/2020, no significant change, first-degree AV block not present (trifascicular block). Confirmed by Jonika Critz, Jagadeesh ([id number]) on 04/14/2024 11:50:02 AM    ASSESSMENT AND PLAN: .      ICD-10-CM   1. Aortic valve disease  I35.9 EKG 12-Lead    ECHOCARDIOGRAM COMPLETE    Lipid panel    2. Hypercholesteremia  E78.00 ezetimibe (ZETIA) 10 MG tablet    Lipid panel    Lipid panel    Lipid panel    3. Primary hypertension  I10 Lipid panel    4. Asymptomatic bilateral carotid artery stenosis  I65.23 VAS US  CAROTID    Lipid panel    5. Abnormal pulse at ankle level  R09.89 VAS US  ABI WITH/WO TBI    Lipid panel    6. Medication management  Z79.899 Lipid panel      Assessment and Plan Assessment & Plan Nonrheumatic aortic valve insufficiency and mild aortic stenosis Mild aortic stenosis with aortic valve insufficiency has slightly progressed per physical exam. No surgical intervention is currently needed. - Order echocardiogram to assess the aortic valve  Carotid artery atherosclerosis with stenosis Bilateral carotid artery stenosis requires  continued monitoring for progression. - Order carotid artery duplex ultrasound to evaluate stenosis  Type 2 diabetes mellitus, well controlled Diabetes is well controlled with current medication regimen. A1c is within target range. - Check ABI due to abnormal ankle level pulse, suspect small vessel disease  Chronic kidney disease, stage 3 a-b (mild to moderate) Kidney function is mild to moderately reduced but stable over the last two years. Current medications, including Jardiance, are protective for kidney function.  Hyperlipidemia LDL cholesterol is slightly elevated at 76 mg/dL. Goal is to reduce LDL to less than 70 mg/dL due to risk factors including diabetes and carotid plaque. - Prescribe Zetia (ezetimibe) to be taken with atorvastatin - Recheck cholesterol  levels before next visit  Hypertension, well controlled Blood pressure is well controlled with current medication regimen.  Follow-Up Follow-up is necessary to monitor the progression of cardiovascular conditions and the effectiveness of new medication. - Schedule follow-up appointment in 3 months - Ensure cholesterol levels are rechecked before the next visit   Signed,  Gordy Bergamo, MD, [facility] 04/15/2024, 7:27 AM [facility] [address] [place], [place] [address] Phone: [phone]. Fax:  [phone]

## 2024-04-14 NOTE — Patient Instructions (Signed)
 Medication Instructions:  Your physician has recommended you make the following change in your medication:   ** Begin Zetia 10mg  - 1 tablet by mouth daily  *If you need a refill on your cardiac medications before your next appointment, please call your pharmacy*  Lab Work: Lipid panel in 3 months before you see Dr Ladona If you have labs (blood work) drawn today and your tests are completely normal, you will receive your results only by: MyChart Message (if you have MyChart) OR A paper copy in the mail If you have any lab test that is abnormal or we need to change your treatment, we will call you to review the results.  Testing/Procedures: Your physician has requested that you have an echocardiogram. Echocardiography is a painless test that uses sound waves to create images of your heart. It provides your doctor with information about the size and shape of your heart and how well your heart's chambers and valves are working. This procedure takes approximately one hour. There are no restrictions for this procedure. Please do NOT wear cologne, perfume, aftershave, or lotions (deodorant is allowed). Please arrive 15 minutes prior to your appointment time.  Please note: We ask at that you not bring children with you during ultrasound (echo/ vascular) testing. Due to room size and safety concerns, children are not allowed in the ultrasound rooms during exams. Our front office staff cannot provide observation of children in our lobby area while testing is being conducted. An adult accompanying a patient to their appointment will only be allowed in the ultrasound room at the discretion of the ultrasound technician under special circumstances. We apologize for any inconvenience.  Your physician has requested that you have an ankle brachial index (ABI). During this test an ultrasound and blood pressure cuff are used to evaluate the arteries that supply the arms and legs with blood. Allow thirty minutes  for this exam. There are no restrictions or special instructions.  Please note: We ask at that you not bring children with you during ultrasound (echo/ vascular) testing. Due to room size and safety concerns, children are not allowed in the ultrasound rooms during exams. Our front office staff cannot provide observation of children in our lobby area while testing is being conducted. An adult accompanying a patient to their appointment will only be allowed in the ultrasound room at the discretion of the ultrasound technician under special circumstances. We apologize for any inconvenience.   Your physician has requested that you have a carotid duplex. This test is an ultrasound of the carotid arteries in your neck. It looks at blood flow through these arteries that supply the brain with blood. Allow one hour for this exam. There are no restrictions or special instructions.    Follow-Up: At [facility], you and your health needs are our priority.  As part of our continuing mission to provide you with exceptional heart care, our providers are all part of one team.  This team includes your primary Cardiologist (physician) and Advanced Practice Providers or APPs (Physician Assistants and Nurse Practitioners) who all work together to provide you with the care you need, when you need it.  Your next appointment:   3 months with Dr Ganji

## 2024-05-02 ENCOUNTER — Ambulatory Visit (HOSPITAL_COMMUNITY)
Admission: RE | Admit: 2024-05-02 | Discharge: 2024-05-02 | Disposition: A | Discharge status: Home / Self Care | Source: Ambulatory Visit | Attending: Cardiology | Admitting: Cardiology

## 2024-05-02 ENCOUNTER — Ambulatory Visit (HOSPITAL_BASED_OUTPATIENT_CLINIC_OR_DEPARTMENT_OTHER)
Admission: RE | Admit: 2024-05-02 | Discharge: 2024-05-02 | Disposition: A | Discharge status: Home / Self Care | Source: Ambulatory Visit | Attending: Cardiology | Admitting: Cardiology

## 2024-05-02 DIAGNOSIS — R0989 Other specified symptoms and signs involving the circulatory and respiratory systems: Secondary | ICD-10-CM | POA: Insufficient documentation

## 2024-05-02 DIAGNOSIS — I6523 Occlusion and stenosis of bilateral carotid arteries: Secondary | ICD-10-CM | POA: Diagnosis not present

## 2024-05-03 ENCOUNTER — Ambulatory Visit: Payer: Self-pay | Admitting: Cardiology

## 2024-05-03 LAB — VAS US ABI WITH/WO TBI
Left ABI: 1.32
Right ABI: 1.32

## 2024-05-03 NOTE — Progress Notes (Signed)
 Carotid artery duplex 05/03/2024:   Mild carotid stenosis right. Moderate  40-59% stenosis left ICA.  No change from 2023

## 2024-05-09 ENCOUNTER — Other Ambulatory Visit: Payer: Self-pay | Admitting: Cardiology

## 2024-05-09 DIAGNOSIS — I10 Essential (primary) hypertension: Secondary | ICD-10-CM

## 2024-05-10 NOTE — Progress Notes (Signed)
 Carotid artery duplex 05/03/2024:   Mild carotid stenosis right. Moderate  40-59% stenosis left ICA.  No change from 2023  Will check in 1 year

## 2024-05-16 ENCOUNTER — Ambulatory Visit (HOSPITAL_COMMUNITY)
Admission: RE | Admit: 2024-05-16 | Discharge: 2024-05-16 | Disposition: A | Discharge status: Home / Self Care | Source: Ambulatory Visit | Attending: Cardiology | Admitting: Cardiology

## 2024-05-16 DIAGNOSIS — R011 Cardiac murmur, unspecified: Secondary | ICD-10-CM | POA: Diagnosis not present

## 2024-05-16 DIAGNOSIS — I359 Nonrheumatic aortic valve disorder, unspecified: Secondary | ICD-10-CM | POA: Diagnosis not present

## 2024-05-16 DIAGNOSIS — R0609 Other forms of dyspnea: Secondary | ICD-10-CM

## 2024-05-16 LAB — ECHOCARDIOGRAM COMPLETE
AR max vel: 1.13 cm2
AV Area VTI: 1.08 cm2
AV Area mean vel: 1.08 cm2
AV Mean grad: 20 mmHg
AV Peak grad: 34.9 mmHg
Ao pk vel: 2.95 m/s
Area-P 1/2: 2.69 cm2
MV VTI: 1.65 cm2
S' Lateral: 2.66 cm

## 2024-05-30 DIAGNOSIS — S8001XA Contusion of right knee, initial encounter: Secondary | ICD-10-CM | POA: Diagnosis not present

## 2024-05-30 DIAGNOSIS — M25561 Pain in right knee: Secondary | ICD-10-CM | POA: Diagnosis not present

## 2024-05-30 DIAGNOSIS — Z96651 Presence of right artificial knee joint: Secondary | ICD-10-CM | POA: Diagnosis not present
